# Patient Record
Sex: Female | Born: 1975 | Race: Black or African American | Hispanic: No | Marital: Single | Smoking: Current every day smoker
Health system: Southern US, Community
[De-identification: ages and names within clinical notes are randomized; demographics above are authoritative.]

## PROBLEM LIST (undated history)

## (undated) ENCOUNTER — Inpatient Hospital Stay (HOSPITAL_COMMUNITY): Payer: Self-pay

## (undated) ENCOUNTER — Ambulatory Visit: Admission: EM | Payer: Medicaid Other

## (undated) DIAGNOSIS — D649 Anemia, unspecified: Secondary | ICD-10-CM

## (undated) DIAGNOSIS — Z5189 Encounter for other specified aftercare: Secondary | ICD-10-CM

## (undated) DIAGNOSIS — R87629 Unspecified abnormal cytological findings in specimens from vagina: Secondary | ICD-10-CM

## (undated) DIAGNOSIS — R768 Other specified abnormal immunological findings in serum: Secondary | ICD-10-CM

## (undated) HISTORY — PX: FRACTURE SURGERY: SHX138

## (undated) HISTORY — PX: OTHER SURGICAL HISTORY: SHX169

---
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
## 1999-03-26 ENCOUNTER — Emergency Department (HOSPITAL_COMMUNITY): Admission: EM | Admit: 1999-03-26 | Discharge: 1999-03-26 | Payer: Self-pay | Admitting: Emergency Medicine

## 1999-05-07 ENCOUNTER — Other Ambulatory Visit: Admission: RE | Admit: 1999-05-07 | Discharge: 1999-05-07 | Payer: Self-pay | Admitting: Gynecology

## 2000-04-09 ENCOUNTER — Emergency Department (HOSPITAL_COMMUNITY): Admission: EM | Admit: 2000-04-09 | Discharge: 2000-04-09 | Payer: Self-pay | Admitting: Emergency Medicine

## 2000-09-26 ENCOUNTER — Inpatient Hospital Stay (HOSPITAL_COMMUNITY): Admission: AD | Admit: 2000-09-26 | Discharge: 2000-09-26 | Payer: Self-pay | Admitting: Obstetrics and Gynecology

## 2000-09-26 ENCOUNTER — Encounter: Payer: Self-pay | Admitting: Obstetrics and Gynecology

## 2000-12-17 ENCOUNTER — Ambulatory Visit (HOSPITAL_COMMUNITY): Admission: RE | Admit: 2000-12-17 | Discharge: 2000-12-17 | Payer: Self-pay | Admitting: *Deleted

## 2001-02-21 ENCOUNTER — Inpatient Hospital Stay (HOSPITAL_COMMUNITY): Admission: AD | Admit: 2001-02-21 | Discharge: 2001-02-21 | Payer: Self-pay | Admitting: Obstetrics & Gynecology

## 2001-05-19 ENCOUNTER — Encounter (INDEPENDENT_AMBULATORY_CARE_PROVIDER_SITE_OTHER): Payer: Self-pay | Admitting: *Deleted

## 2001-05-19 ENCOUNTER — Inpatient Hospital Stay (HOSPITAL_COMMUNITY): Admission: AD | Admit: 2001-05-19 | Discharge: 2001-05-23 | Payer: Self-pay | Admitting: *Deleted

## 2001-05-24 ENCOUNTER — Encounter: Admission: RE | Admit: 2001-05-24 | Discharge: 2001-06-23 | Payer: Self-pay | Admitting: Obstetrics

## 2002-03-17 ENCOUNTER — Other Ambulatory Visit: Admission: RE | Admit: 2002-03-17 | Discharge: 2002-03-17 | Payer: Self-pay | Admitting: Family Medicine

## 2003-03-17 ENCOUNTER — Emergency Department (HOSPITAL_COMMUNITY): Admission: EM | Admit: 2003-03-17 | Discharge: 2003-03-17 | Payer: Self-pay | Admitting: Emergency Medicine

## 2003-10-15 ENCOUNTER — Emergency Department (HOSPITAL_COMMUNITY): Admission: EM | Admit: 2003-10-15 | Discharge: 2003-10-15 | Payer: Self-pay | Admitting: Emergency Medicine

## 2004-11-27 ENCOUNTER — Ambulatory Visit (HOSPITAL_COMMUNITY): Admission: RE | Admit: 2004-11-27 | Discharge: 2004-11-27 | Payer: Self-pay | Admitting: Family Medicine

## 2006-04-25 ENCOUNTER — Emergency Department (HOSPITAL_COMMUNITY): Admission: EM | Admit: 2006-04-25 | Discharge: 2006-04-25 | Payer: Self-pay | Admitting: Family Medicine

## 2006-05-14 ENCOUNTER — Emergency Department (HOSPITAL_COMMUNITY): Admission: EM | Admit: 2006-05-14 | Discharge: 2006-05-14 | Payer: Self-pay | Admitting: Emergency Medicine

## 2007-05-02 ENCOUNTER — Emergency Department (HOSPITAL_COMMUNITY): Admission: EM | Admit: 2007-05-02 | Discharge: 2007-05-03 | Payer: Self-pay | Admitting: *Deleted

## 2007-05-10 ENCOUNTER — Ambulatory Visit (HOSPITAL_COMMUNITY): Admission: RE | Admit: 2007-05-10 | Discharge: 2007-05-10 | Payer: Self-pay | Admitting: Orthopedic Surgery

## 2007-07-17 ENCOUNTER — Emergency Department (HOSPITAL_COMMUNITY): Admission: EM | Admit: 2007-07-17 | Discharge: 2007-07-17 | Payer: Self-pay | Admitting: Family Medicine

## 2007-09-07 ENCOUNTER — Encounter: Admission: RE | Admit: 2007-09-07 | Discharge: 2007-09-07 | Payer: Self-pay | Admitting: Family Medicine

## 2007-10-14 ENCOUNTER — Other Ambulatory Visit: Admission: RE | Admit: 2007-10-14 | Discharge: 2007-10-14 | Payer: Self-pay | Admitting: Obstetrics and Gynecology

## 2009-01-22 IMAGING — CR DG ANKLE COMPLETE 3+V*R*
3 series · 3 of 3 positions shown · non-contrast
Comparison: none

CLINICAL DATA: Fall.  Ankle fracture and dislocation.
RIGHT ANKLE ? 3 VIEW:

[view not recorded (1 of 3)]
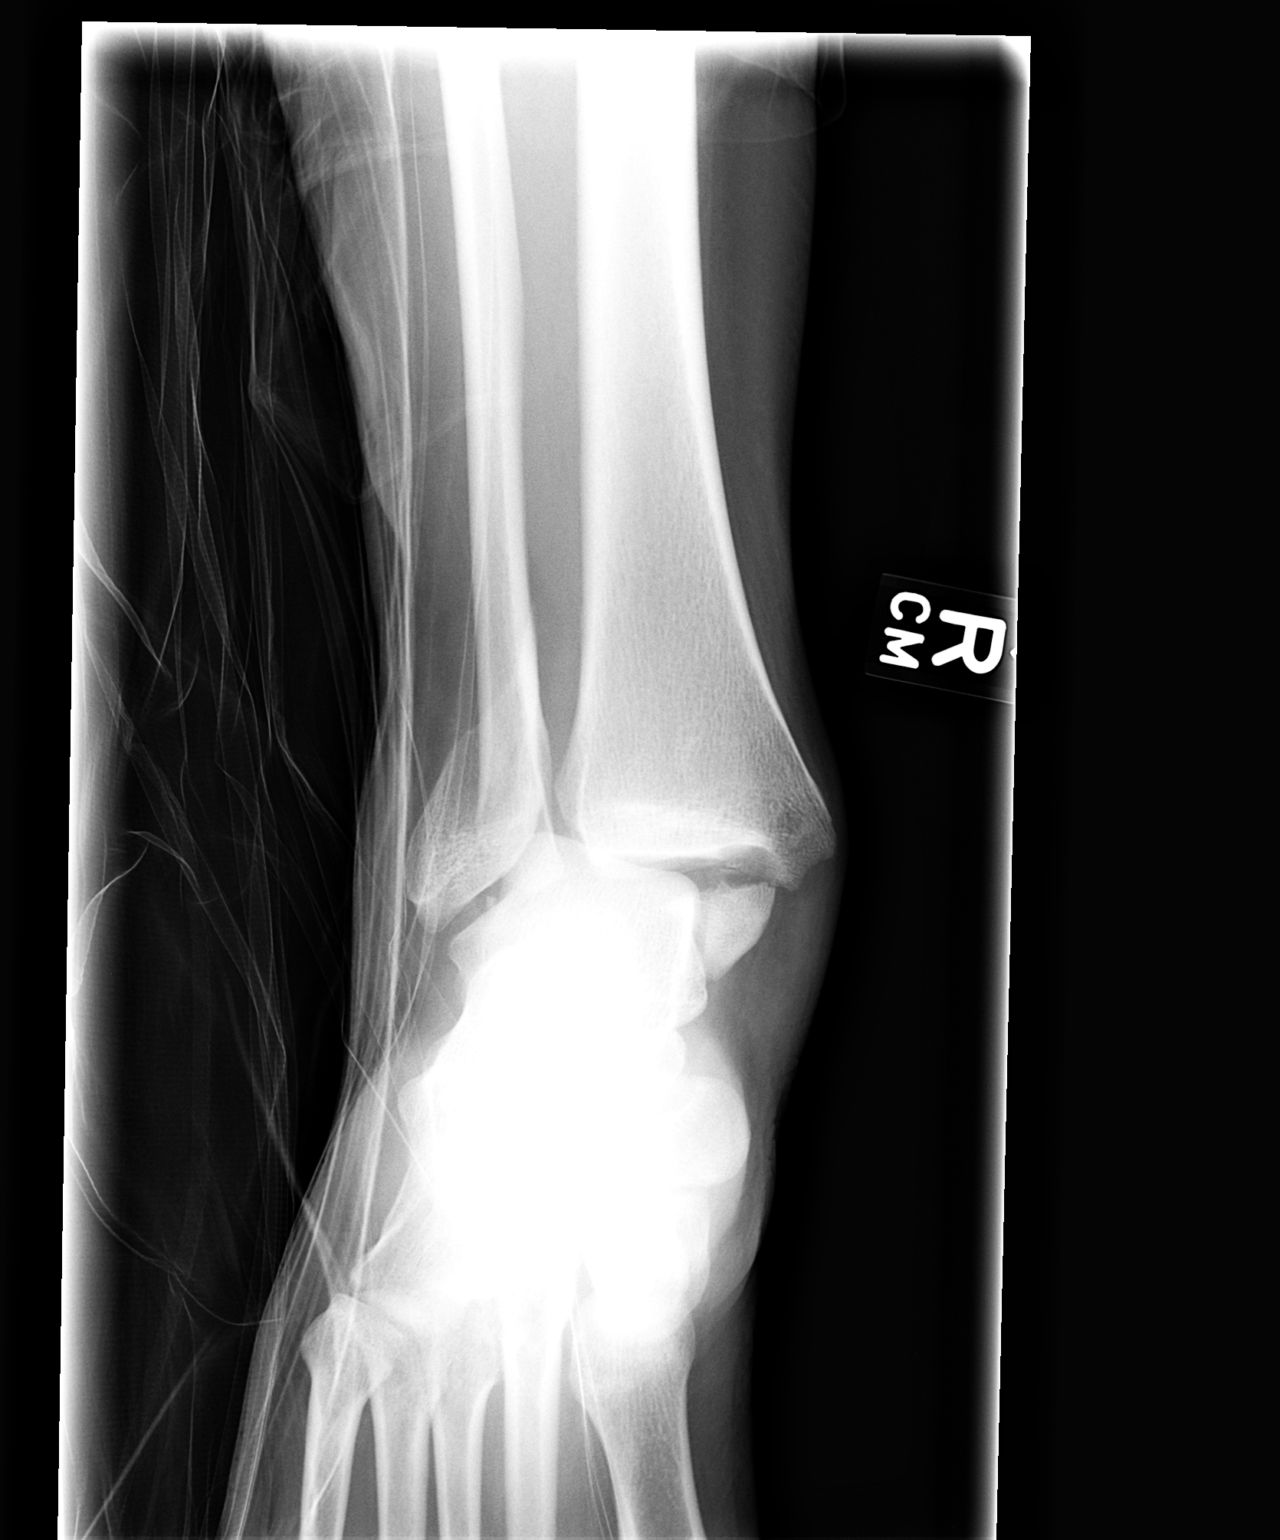

[view not recorded (2 of 3)]
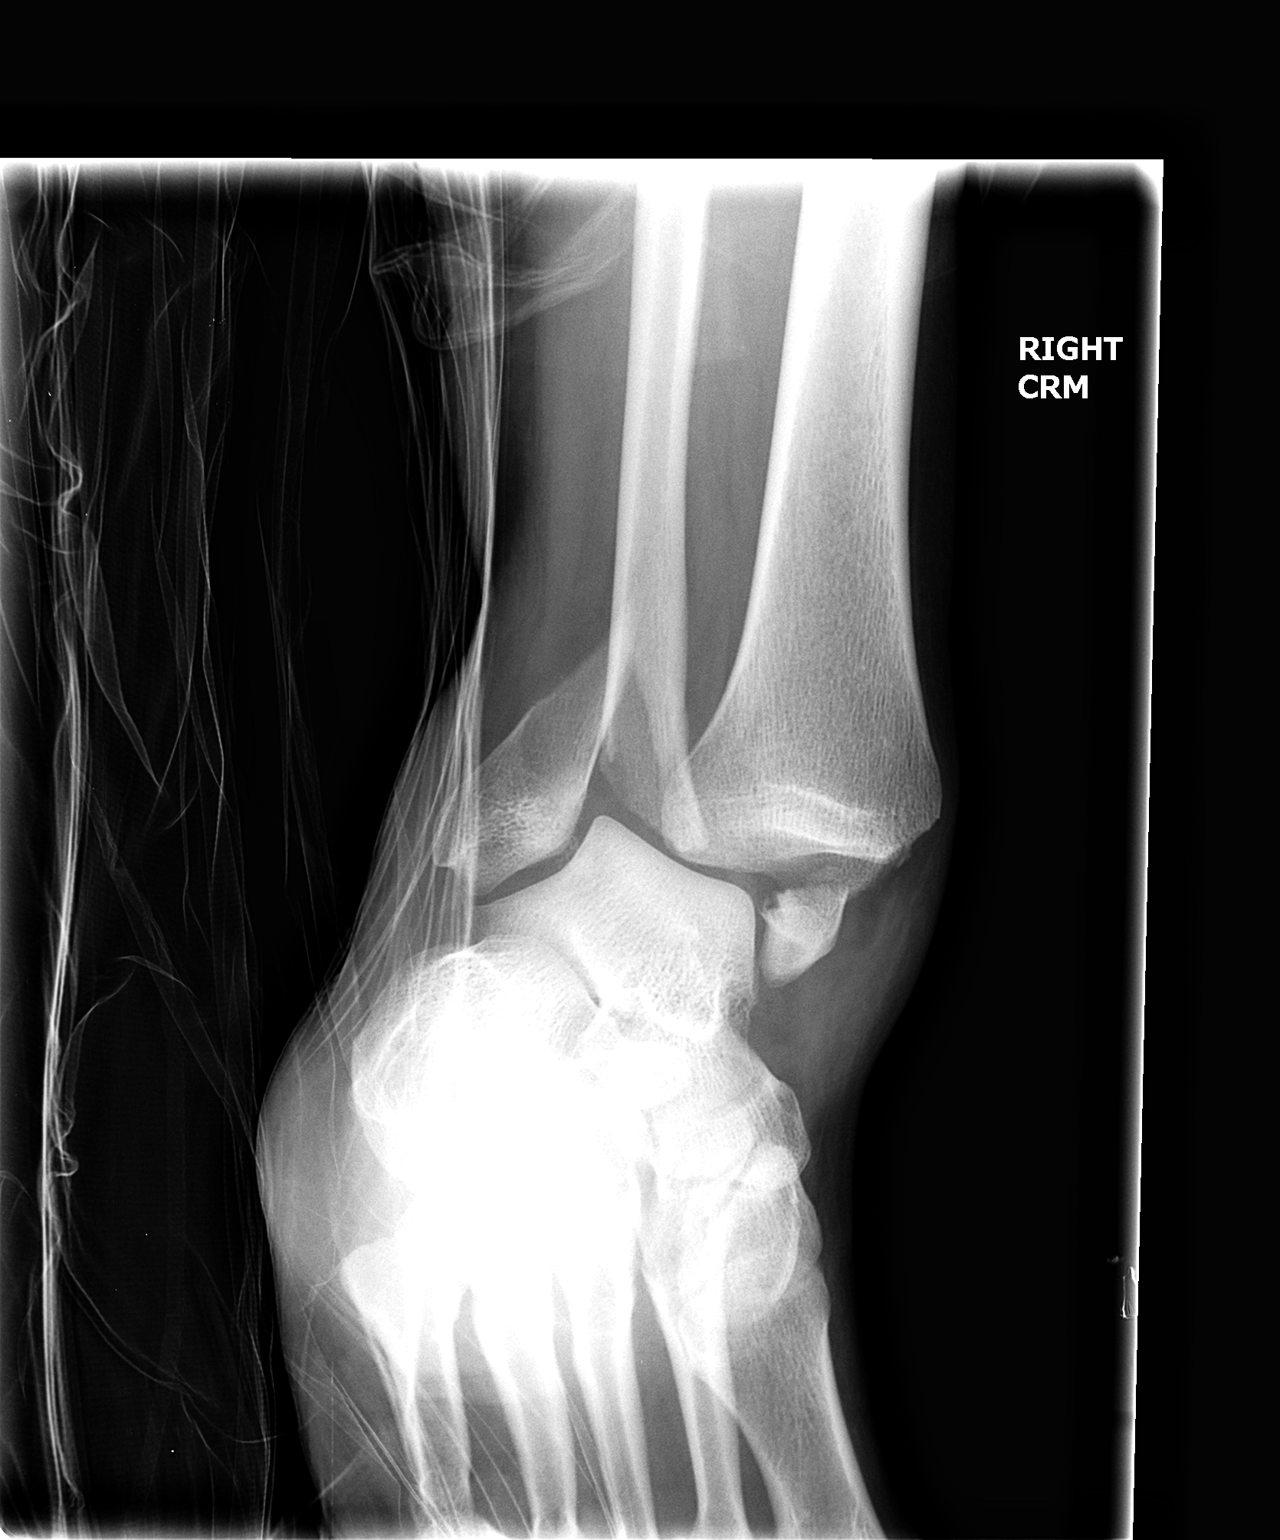

[view not recorded (3 of 3)]
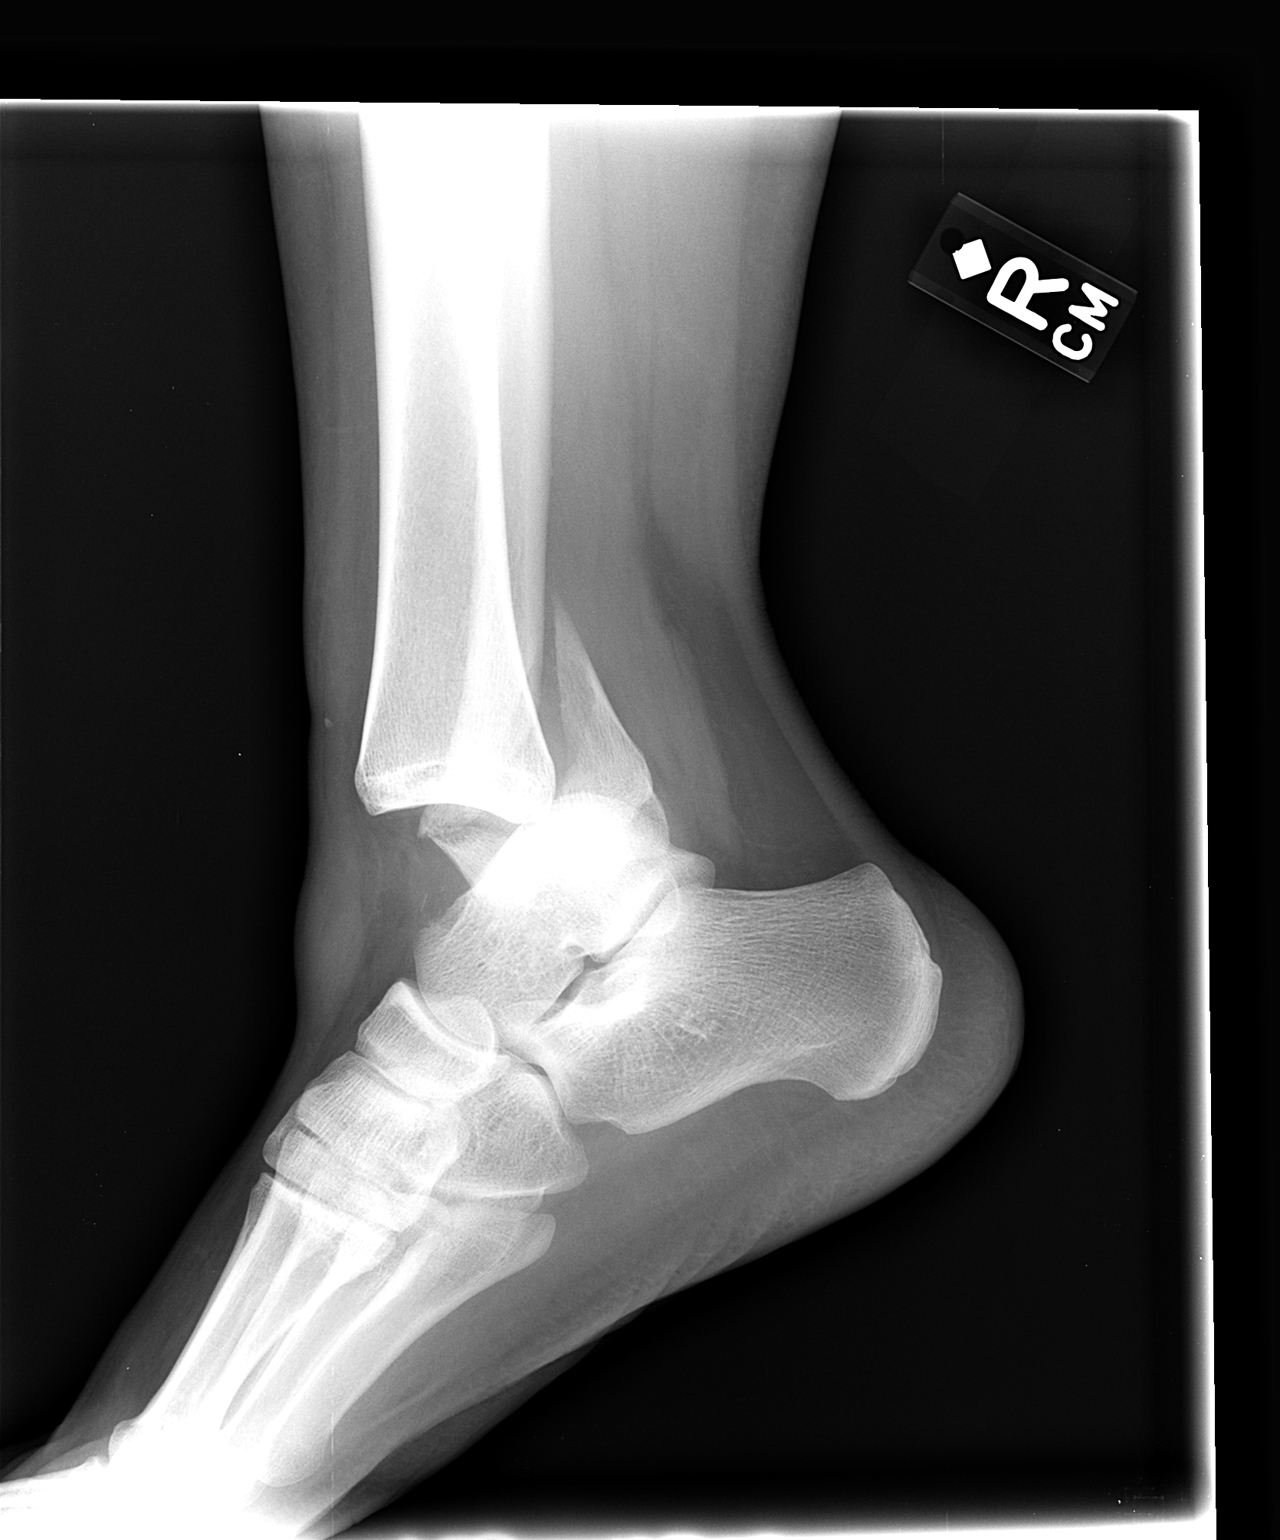

[3 of 3 positions shown; findings below may reference images not displayed]

FINDINGS: Bimalleolar ankle fracture is seen with posterior and lateral dislocation of the talus.
IMPRESSION: Bimalleolar ankle fracture, with talus dislocated laterally and posteriorly.

## 2009-01-22 IMAGING — CR DG HAND COMPLETE 3+V*L*
3 series · 3 of 3 positions shown · non-contrast
Comparison: none

CLINICAL DATA: Left hand injury.
LEFT HAND ? 3 VIEW:

[x hand pa left *]
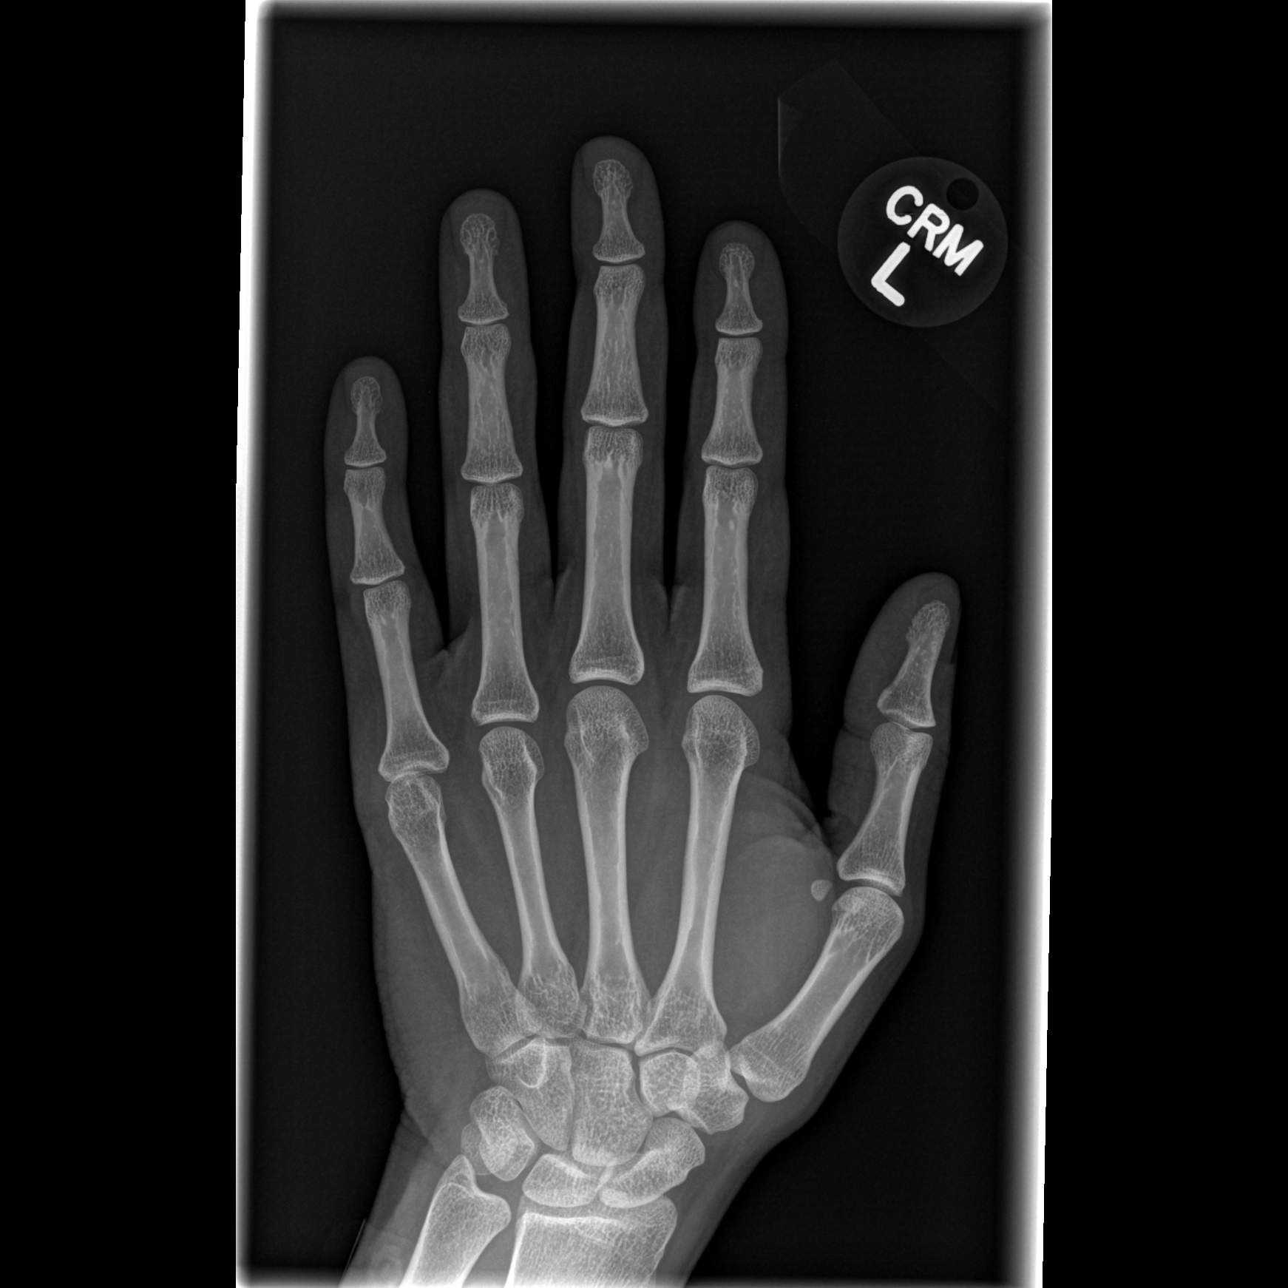

[x hand oblique left *]
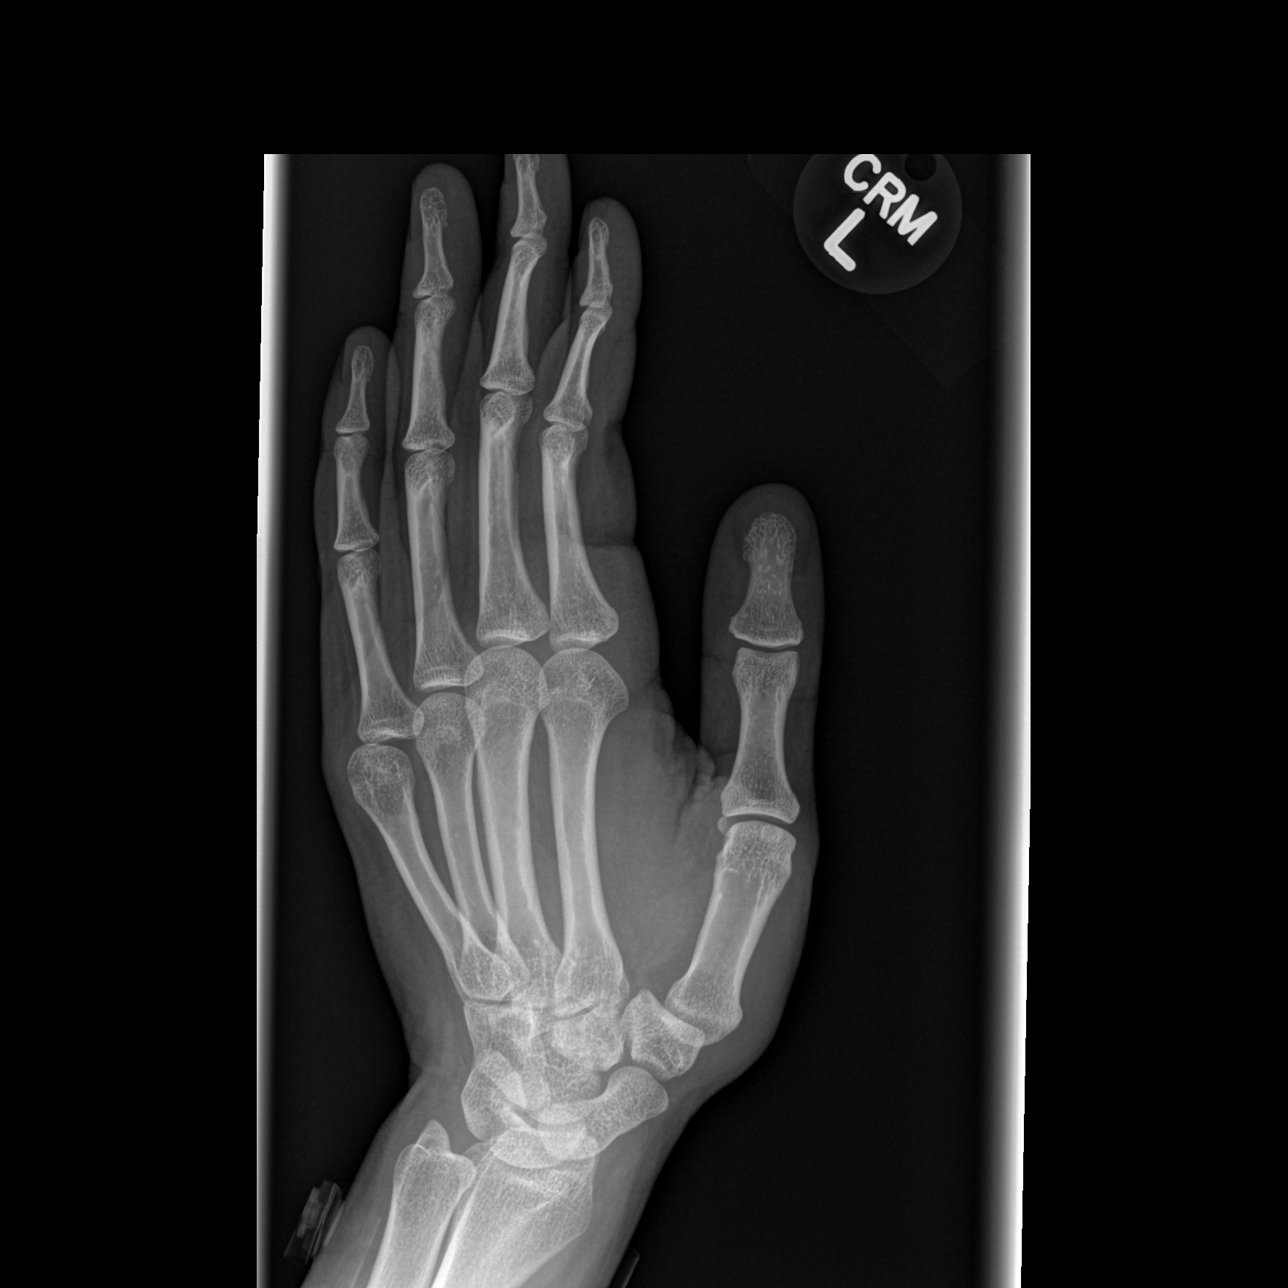

[x hand lat left *]
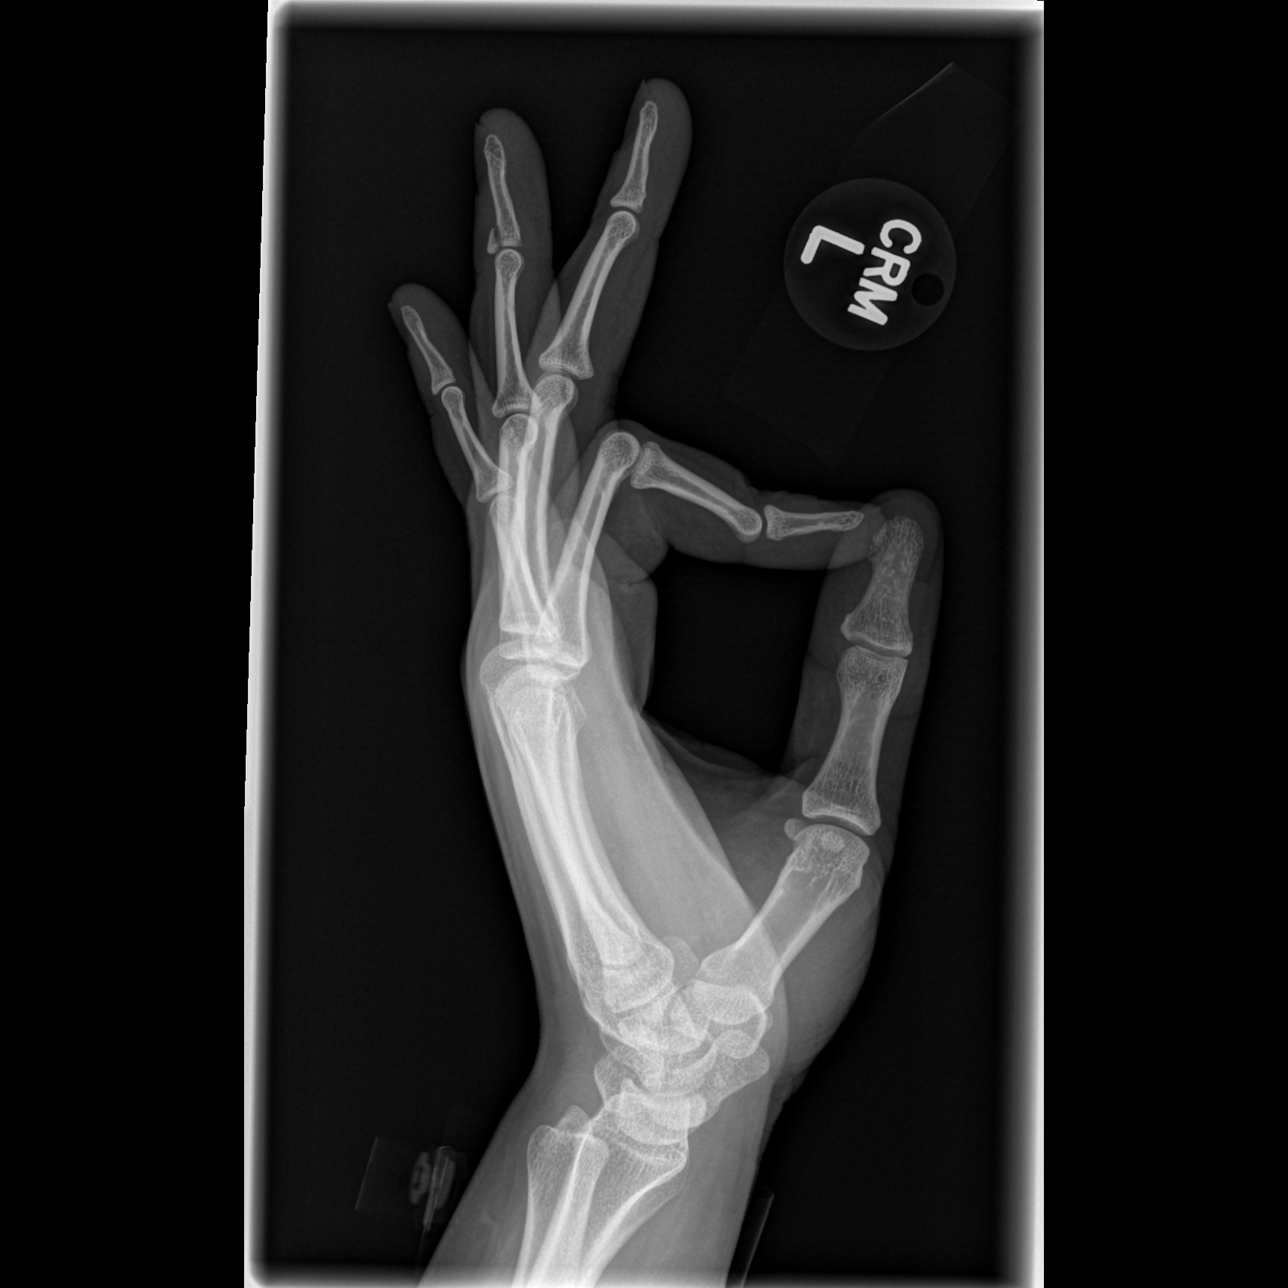

[3 of 3 positions shown; findings below may reference images not displayed]

FINDINGS: There is a fracture along the dorsal base of the distal phalanx of the left ring finger extending into the DIP joint.  There is no evidence of subluxation or dislocation.  The remainder of the left hand is unremarkable.
IMPRESSION: Fracture of the base of the distal phalanx of the ring finger at the DIP joint.

## 2009-03-23 ENCOUNTER — Encounter: Admission: RE | Admit: 2009-03-23 | Discharge: 2009-03-23 | Payer: Self-pay | Admitting: Family Medicine

## 2010-03-26 ENCOUNTER — Other Ambulatory Visit: Admission: RE | Admit: 2010-03-26 | Discharge: 2010-03-26 | Payer: Self-pay | Admitting: Obstetrics and Gynecology

## 2010-10-08 NOTE — Op Note (Signed)
NAME:  Jill Mcdaniel, Jill Mcdaniel              ACCOUNT NO.:  [id number]   MEDICAL RECORD NO.:  [id number]          PATIENT TYPE:  AMB   LOCATION:  DAY                          FACILITY:  [facility]   PHYSICIAN:  Madlyn Frankel. Charlann Boxer, M.D.  DATE OF BIRTH:  1976/03/24   DATE OF PROCEDURE:  05/10/2007  DATE OF DISCHARGE:                               OPERATIVE REPORT   PREOPERATIVE DIAGNOSIS:  Unstable right bimalleolar ankle fracture.   POSTOPERATIVE DIAGNOSIS:  Unstable right bimalleolar ankle fracture.   PROCEDURE:  Open reduction internal fixation right ankle fracture using  Synthes small frag set.   SURGEON:  Dr. Charlann Boxer.   ASSISTANT:  Dwyane Luo.   ANESTHESIA:  General.   BLOOD LOSS:  None.   TOURNIQUET TIME:  88 minutes at 250 mmHg.   COMPLICATIONS:  None.   INDICATIONS FOR PROCEDURE:  Jill Mcdaniel is a 35 year old female seen in the  office after an injury where she tripped on cord rolling her foot. She  had been seen initially in the emergency room where the fracture was  reduced and placed in a splint. She was seen in the office. The risks  and benefits of operative treatment were discussed as nonoperative  intervention would potentially lead to risk of nonunion, malunion, and  unstable ankle. She accepted the risks for benefit of better aligned  ankle. The postoperative course was also discussed, consent obtained.   PROCEDURE IN DETAIL:  The patient was brought to the operative theater.  Once adequate anesthesia, preoperative antibiotics, Ancef, were  administered, the patient was positioned supine. A proximal thigh  tourniquet was placed. I then prescrubbed the right and then prepped and  draped it in a sterile fashion. Landmarks were identified and attention  was first directed laterally. A lateral incision was made from the skin  directly down to bone and then elevated the soft tissues off of the  fracture site. We cleaned of the fracture site from any residual  periosteum.   TECHNIQUE:  I  utilized the lag screw technique with a neutralization  plate. Initially I tried it with a 3.5 screw but the purchase in the  posterior distal portion of the fibula was fairly weak. After I had  initially tried it once, I did try a posterior and a glide technique.  However, the patient's fracture pattern had a spiral portion off the  medial side of the distal fibula making it difficult to maintain  alignment this was. I went back to the lag screw technique.  At this  time, I utilized a  4-0 cancellous screw getting a better purchase. With this anatomically  reduced, was able to get a five-hole neutralization plate, one-third  tubular plate situated. Screws were placed to stabilize the plate in  position.  Radiographs indicate an anatomically reduced distal fibula.  Following this, attention was directed to the medial aspect of the  tibia. A 4-cm incision was made over the fracture site. The fracture was  debrided of periosteum that was within the fracture site.  Once it was  free, I was able to anatomically reduce it, hold it in position  with a  bone tenaculum. It was a relatively small fragment in this Weber B  pattern, so I chose to use a single screw for stabilization. A 2.7 drill  bit was passed and checked under fluoroscopy and then a single 45-mm  partially threaded cancellous screw was passed with a good purchase. The  fracture remained reduced. At this point, the wound was irrigated with  normal saline solution. The medial wound was closed in layers with 2-0  Vicryl and 3-0 nylon. On the lateral side, I reapproximated the fascial  layer first and then the subcu layer and then 3-0 nylon on the skin. The  wounds were cleaned, dried and dressed sterilely with Xeroform dressing  sponges and a bulky wrap.  I placed the patient into a bulky wrap at  this point with 3 ABDs.   At this point, the posterior splint L and U were placed and the foot  held in neutral position.  The patient was  then extubated and brought to  the recovery room in stable addition.   She will be discharged today and return to see me in the office in 2  weeks.  Please note that I did inject the patient with local anesthetic  at the time of skin closure.      Madlyn Frankel Charlann Boxer, M.D.  Electronically Signed     MDO/MEDQ  D:  05/10/2007  T:  05/10/2007  Job:  [id number]

## 2010-10-11 NOTE — Discharge Summary (Signed)
[facility]  Patient:    Jill Mcdaniel, Jill Mcdaniel Visit Number: [id number] MRN: [id number]          Service Type: OBS Location: 910A 9111 01 Attending Physician:  Venita Sheffield Dictated by:   Kathreen Cosier, M.D. Admit Date:  05/19/2001 Discharge Date: 05/23/2001                             Discharge Summary  HISTORY OF PRESENT ILLNESS:  The patient is a 35 year old primigravida, estimated date of confinement 12/23 to 12/26.  HOSPITAL COURSE:  She was admitted in labor on 12/25.  At 6:30 a.m. on 12/26, she was 8 cm, very loose cervix, vertex.  ______ was performed, the fluid was clear.  Tracing was reactive.  She was started on Pitocin stimulation, and by 8:15 on 12/26, she was 8 cm, vertex, -3, temperature up to 104, started on ampicillin 2 g IV q.6h.  Subsequently, the fetal heart rate changed to 170 to 180 with late decelerations with each contraction.  These did not resolve with discontinuation of the Pitocin or with O2 or position change.  She underwent a primary low transverse cesarean section because of nonreassuring fetal heart rate tracing, maternal fever.  Had a female with Apgars of 8 and 8, 7 pounds 2 ounces.  The placenta was posterior and sent to pathology.  Postoperative hemoglobin was 10.  She did well.  She was discharged home on the third postoperative day, ambulatory and on a regular diet.  DISCHARGE MEDICATIONS: 1. Tylox one p.o. q.4h. p.r.n. 2. Micronor one p.o. q.d. 3. Ferrous sulfate 325 mg q.d.  DISCHARGE DIAGNOSIS:  Status post primary low transverse cesarean section at term for nonreassuring fetal heart rate tracing. Dictated by:   Kathreen Cosier, M.D. Attending Physician:  Venita Sheffield DD:  05/23/01 TD:  05/23/01 Job: [id number]

## 2010-10-11 NOTE — H&P (Signed)
[facility]  Patient:    Jill Mcdaniel, Jill Mcdaniel Visit Number: [id number] MRN: [id number]          Service Type: OBS Location: 910A 9111 01 Attending Physician:  Venita Sheffield Dictated by:   Kathreen Cosier, M.D. Admit Date:  05/19/2001                           History and Physical  HISTORY OF PRESENT ILLNESS:   The patient is a 35 year old primigravida with an EDC of December 23 to May 20, 2001.  She was admitted on the night of December 25 in labor.  She had a negative GBS.  She was 8 cm with the vertex at -3.  Amniotomy was performed and the fluid was clear.  She was started on Pitocin stimulation.  By 7:30, her temperature was 100.4.  Fetal heart was 160-170+.  She was given ampicillin 2 g IV q.6h. and started on Pitocin.  She began having decreased variability and decelerations with each contraction, which did not resolve with O2 or position change.  It was decided she would undergo C-section because of nonreassuring fetal heart rate tracing.  PHYSICAL EXAMINATION:  GENERAL:                      Well-developed female appearing her stated age.  HEENT:                        Negative.  LUNGS:                        Clear.  HEART:                        Regular rhythm.  No murmurs.  No gallops.  ABDOMEN:                      Term-size uterus.  Estimated fetal weight 7 lb.  PELVIC:                       As described above.  EXTREMITIES:                  Negative. Dictated by:   Kathreen Cosier, M.D. Attending Physician:  Venita Sheffield DD:  05/20/01 TD:  05/20/01 Job: [id number]

## 2010-10-11 NOTE — Op Note (Signed)
[facility]  Patient:    Jill Mcdaniel, Jill Mcdaniel Visit Number: [id number] MRN: [id number]          Service Type: OBS Location: 910A 9111 01 Attending Physician:  Venita Sheffield Dictated by:   Kathreen Cosier, M.D. Proc. Date: 05/20/01 Admit Date:  05/19/2001                             Operative Report  PREOPERATIVE DIAGNOSIS:       Nonreassuring fetal heart rate tracing, decelerations.  POSTOPERATIVE DIAGNOSIS:      Nonreassuring fetal heart rate tracing, decelerations.  OPERATION:  SURGEON:                      Kathreen Cosier, M.D.  ANESTHESIA:                   Epidural.  DESCRIPTION OF PROCEDURE:     The patient was placed on the operating table in the supine position.  After epidural dosed, abdomen prepped and draped. Bladder emptied by the Foley catheter.  A transverse suprapubic incision was made, carried down to the rectus fascia.  Fascia incised the length of the incision.  Rectus muscles retracted laterally and incised longitudinally. Transverse incision made in the visceroperitoneum above the bladder.  Bladder mobilized inferiorly.  A transverse lower uterine incision made. The patient delivered from the LOA position of a female, Apgars 8 and 8.  Team was in attendance and _____ .  The baby weighed 7 pounds 2 ounces.  The placenta was posterior, removed manually.  Uterine cavity cleaned and dried.  Uterine incision closed in one layer with continuous suture of #1 chromic.  Hemostasis was satisfactory.  Bladder flap reattached with 2-0 chromic.  Uterus well contracted.  Tubes and ovaries were normal.  Abdomen closed in layers. Peritoneum with continuous sutures of 0 chromic, fascia with continuous sutures of 0 Dexon and the skin closed with subcuticular stitch of 3-0 Monocryl. Dictated by:   Kathreen Cosier, M.D. Attending Physician:  Venita Sheffield DD:  05/20/01 TD:  05/20/01 Job:  [id number]

## 2011-01-09 ENCOUNTER — Other Ambulatory Visit: Payer: Self-pay | Admitting: Family Medicine

## 2011-01-09 ENCOUNTER — Other Ambulatory Visit (HOSPITAL_COMMUNITY)
Admission: RE | Admit: 2011-01-09 | Discharge: 2011-01-09 | Disposition: A | Discharge status: Home / Self Care | Payer: 59 | Source: Ambulatory Visit | Attending: Family Medicine | Admitting: Family Medicine

## 2011-01-09 DIAGNOSIS — R87610 Atypical squamous cells of undetermined significance on cytologic smear of cervix (ASC-US): Secondary | ICD-10-CM | POA: Insufficient documentation

## 2011-02-28 LAB — URINALYSIS, ROUTINE W REFLEX MICROSCOPIC
Bilirubin Urine: NEGATIVE
Glucose, UA: NEGATIVE
Ketones, ur: NEGATIVE
Nitrite: NEGATIVE
Protein, ur: 100 — AB
Specific Gravity, Urine: 1.037 — ABNORMAL HIGH
Urobilinogen, UA: 0.2
pH: 5.5

## 2011-02-28 LAB — APTT: aPTT: 30

## 2011-02-28 LAB — BASIC METABOLIC PANEL
BUN: 9
GFR calc Af Amer: >60
GFR calc non Af Amer: >60
Glucose, Bld: 87
Sodium: 138

## 2011-02-28 LAB — PROTIME-INR
INR: 1
Prothrombin Time: 13.1

## 2011-02-28 LAB — URINE MICROSCOPIC-ADD ON

## 2011-02-28 LAB — CBC
Hemoglobin: 8.6 — ABNORMAL LOW
MCHC: 32.9
Platelets: 329
RBC: 3.43 — ABNORMAL LOW

## 2011-02-28 LAB — DIFFERENTIAL: Neutrophils Relative %: 60

## 2011-03-03 LAB — CBC
HCT: 32 — ABNORMAL LOW
MCHC: 32.1
Platelets: 397
RDW: 16.8 — ABNORMAL HIGH
WBC: 13.4 — ABNORMAL HIGH

## 2011-03-03 LAB — BASIC METABOLIC PANEL
BUN: 14
Chloride: 102
GFR calc Af Amer: >60
Glucose, Bld: 92
Potassium: 3.2 — ABNORMAL LOW
Sodium: 133 — ABNORMAL LOW

## 2011-03-03 LAB — DIFFERENTIAL
Basophils Relative: 1
Lymphocytes Relative: 13
Lymphs Abs: 1.8
Monocytes Absolute: 0.9
Neutro Abs: 10.7 — ABNORMAL HIGH
Neutrophils Relative %: 80 — ABNORMAL HIGH

## 2011-08-17 ENCOUNTER — Encounter (HOSPITAL_COMMUNITY): Payer: Self-pay | Admitting: *Deleted

## 2011-08-17 ENCOUNTER — Emergency Department (HOSPITAL_COMMUNITY)
Admission: EM | Admit: 2011-08-17 | Discharge: 2011-08-17 | Disposition: A | Discharge status: Home / Self Care | Payer: Self-pay | Attending: Emergency Medicine | Admitting: Emergency Medicine

## 2011-08-17 DIAGNOSIS — L02412 Cutaneous abscess of left axilla: Secondary | ICD-10-CM

## 2011-08-17 DIAGNOSIS — F17200 Nicotine dependence, unspecified, uncomplicated: Secondary | ICD-10-CM | POA: Insufficient documentation

## 2011-08-17 DIAGNOSIS — IMO0002 Reserved for concepts with insufficient information to code with codable children: Secondary | ICD-10-CM | POA: Insufficient documentation

## 2011-08-17 MED ORDER — LIDOCAINE-EPINEPHRINE 2 %-1:100000 IJ SOLN
20.0000 mL | Freq: Once | INTRAMUSCULAR | Status: AC
  Administered 2011-08-17: 20 mL

## 2011-08-17 NOTE — ED Provider Notes (Signed)
History     CSN: [id number]  Arrival date & time 08/17/11  1222   First MD Initiated Contact with Patient 08/17/11 1324      Chief Complaint  Patient presents with  . Recurrent Skin Infections    "Boil" left arm pit    (Consider location/radiation/quality/duration/timing/severity/associated sxs/prior treatment) HPI History from patient. 36 year old female with history of abscesses in the past presents with an abscess to her left axilla. She states that this came up approximately 3-4 days ago and has increased in size since then. She has attempted warm compresses numerous times without relief. Has been taking ibuprofen for pain which has been minimally helpful. Pain is worse with adducting her arm.Has not noticed any drainage from the area. Has never had to have incision and drainage in the past. Denies fever, chills, nausea, vomiting, or other systemic complaints. Does not have a history of diabetes.   History reviewed. No pertinent past medical history.  Past Surgical History  Procedure Date  . Fracture surgery   . Abortions     History reviewed. No pertinent family history.  History  Substance Use Topics  . Smoking status: Current Everyday Smoker -- 1.0 packs/day    Types: Cigarettes  . Smokeless tobacco: Never Used  . Alcohol Use: Yes     socially    OB History    Grav Para Term Preterm Abortions TAB SAB Ect Mult Living                  Review of Systems  Constitutional: Negative for fever, chills, activity change and appetite change.  Gastrointestinal: Negative for nausea and vomiting.  Musculoskeletal: Negative for myalgias and arthralgias.  Skin: Positive for color change and wound.    Allergies  Review of patient's allergies indicates no known allergies.  Home Medications   Current Outpatient Rx  Name Route Sig Dispense Refill  . IBUPROFEN 200 MG PO TABS Oral Take 400 mg by mouth every 6 (six) hours as needed. pain      BP 147/91  Pulse 85   Temp(Src) 97.6 F (36.4 C) (Axillary)  Resp 18  Wt 185 lb (83.915 kg)  SpO2 100%  LMP 08/17/2011  Physical Exam  Nursing note and vitals reviewed. Constitutional: She appears well-developed and well-nourished. No distress.  HENT:  Head: Normocephalic and atraumatic.  Cardiovascular: Normal rate.   Pulmonary/Chest: Effort normal.  Musculoskeletal: Normal range of motion.  Neurological: She is alert.  Skin: Skin is warm and dry. She is not diaphoretic.       Large, approximately 4-5 cm x 1.5 cm area of induration consistent with abscess to the left axilla. There is no overlying cellulitis. Area is tender to palpation. No drainage noted at present.  Psychiatric: She has a normal mood and affect.    ED Course  Procedures (including critical care time)  INCISION AND DRAINAGE Performed by: Grant Fontana Consent: Verbal consent obtained. Risks and benefits: risks, benefits and alternatives were discussed Type: abscess  Body area: L axilla  Anesthesia: local infiltration  Local anesthetic: lidocaine 2% with epinephrine  Anesthetic total: 2.5 ml  Complexity: complex Blunt dissection to break up loculations  Drainage: purulent  Drainage amount: copious  Packing material: 1/4 in iodoform gauze  Patient tolerance: Patient tolerated the procedure well with no immediate complications.     Labs Reviewed - No data to display No results found.   1. Abscess of left axilla       MDM  Abscess was incised  and drained, which patient tolerated well. It was packed with iodoform gauze. She was instructed to return to the ED or to urgent care in 2 days for a wound recheck and packing removal. Instructed to use ibuprofen as needed for pain. Return precautions discussed.        Grant Fontana, [place] 08/17/11 1537

## 2011-08-17 NOTE — Discharge Instructions (Signed)
Your abscess has been drained. Please keep the area clean and dry and covered with a bandage. You may shower as normal as long as you do not scrubbed vigorously at the area. We will need to followup in 2 days for a wound recheck. This may be done here or at urgent care. If you run a high fever, noticed increased swelling or redness around the area, or have any other worrisome symptoms, please return to the ER.  Abscess Care After An abscess (also called a boil or furuncle) is an infected area that contains a collection of pus. Signs and symptoms of an abscess include pain, tenderness, redness, or hardness, or you may feel a moveable soft area under your skin. An abscess can occur anywhere in the body. The infection may spread to surrounding tissues causing cellulitis. A cut (incision) by the surgeon was made over your abscess and the pus was drained out. Gauze may have been packed into the space to provide a drain that will allow the cavity to heal from the inside outwards. The boil may be painful for 5 to 7 days. Most people with a boil do not have high fevers. Your abscess, if seen early, may not have localized, and may not have been lanced. If not, another appointment may be required for this if it does not get better on its own or with medications. HOME CARE INSTRUCTIONS   Only take over-the-counter or prescription medicines for pain, discomfort, or fever as directed by your caregiver.   When you bathe, soak and then remove gauze or iodoform packs at least daily or as directed by your caregiver. You may then wash the wound gently with mild soapy water. Repack with gauze or do as your caregiver directs.  SEEK IMMEDIATE MEDICAL CARE IF:   You develop increased pain, swelling, redness, drainage, or bleeding in the wound site.   You develop signs of generalized infection including muscle aches, chills, fever, or a general ill feeling.   An oral temperature above 102 F (38.9 C) develops, not  controlled by medication.  See your caregiver for a recheck if you develop any of the symptoms described above. If medications (antibiotics) were prescribed, take them as directed. Document Released: 11/28/2004 Document Revised: 05/01/2011 Document Reviewed: 07/26/2007 [employer] Patient Information 2012 [place], [place].

## 2011-08-17 NOTE — ED Notes (Signed)
Pt from home with reports of "a boil" in left arm pit that was first noticed 3-4 days ago, has tried warm compresses and has been taking Ibuprofen for pain with no improvement. Pt denies oozing or drainage from area.

## 2011-08-17 NOTE — ED Provider Notes (Signed)
Medical screening examination/treatment/procedure(s) were performed by non-physician practitioner and as supervising physician I was immediately available for consultation/collaboration.   Carleene Cooper III, MD 08/17/11 [phone]

## 2011-08-17 NOTE — ED Notes (Signed)
PA at the bedside.

## 2011-08-17 NOTE — ED Notes (Signed)
Pt c/o nodule in left armpit, stated it started 4 days ago and has gotten progressively larger. Tender to touch. Approx 1 /2 inch long

## 2011-08-20 ENCOUNTER — Emergency Department (HOSPITAL_COMMUNITY)
Admission: EM | Admit: 2011-08-20 | Discharge: 2011-08-20 | Disposition: A | Discharge status: Home / Self Care | Payer: Self-pay | Attending: Emergency Medicine | Admitting: Emergency Medicine

## 2011-08-20 ENCOUNTER — Encounter (HOSPITAL_COMMUNITY): Payer: Self-pay | Admitting: Emergency Medicine

## 2011-08-20 DIAGNOSIS — Z4801 Encounter for change or removal of surgical wound dressing: Secondary | ICD-10-CM | POA: Insufficient documentation

## 2011-08-20 DIAGNOSIS — F17200 Nicotine dependence, unspecified, uncomplicated: Secondary | ICD-10-CM | POA: Insufficient documentation

## 2011-08-20 DIAGNOSIS — Z5189 Encounter for other specified aftercare: Secondary | ICD-10-CM

## 2011-08-20 NOTE — ED Provider Notes (Signed)
Medical screening examination/treatment/procedure(s) were performed by non-physician practitioner and as supervising physician I was immediately available for consultation/collaboration.  Aiana Nordquist, MD 08/20/11 1549 

## 2011-08-20 NOTE — ED Provider Notes (Signed)
History     CSN: [id number]  Arrival date & time 08/20/11  1130   First MD Initiated Contact with Patient 08/20/11 1153      Chief Complaint  Patient presents with  . Wound Check    (Consider location/radiation/quality/duration/timing/severity/associated sxs/prior treatment) HPI Comments: Patient presents to ED for removal of packing for abscess under left axilla. Patient had I&D done in ED 3 days ago.  Pt states that since then, the wound continues to drain purulent material, the pain is greatly improved, denies fevers.    Patient is a 36 y.o. female presenting with wound check. The history is provided by the patient and medical records.  Wound Check     History reviewed. No pertinent past medical history.  Past Surgical History  Procedure Date  . Fracture surgery   . Abortions     No family history on file.  History  Substance Use Topics  . Smoking status: Current Everyday Smoker -- 1.0 packs/day    Types: Cigarettes  . Smokeless tobacco: Never Used  . Alcohol Use: Yes     socially    OB History    Grav Para Term Preterm Abortions TAB SAB Ect Mult Living                  Review of Systems  Constitutional: Negative for fever.  All other systems reviewed and are negative.    Allergies  Review of patient's allergies indicates no known allergies.  Home Medications   Current Outpatient Rx  Name Route Sig Dispense Refill  . IBUPROFEN 200 MG PO TABS Oral Take 400 mg by mouth every 6 (six) hours as needed. pain      BP 137/89  Pulse 91  Temp(Src) 98.8 F (37.1 C) (Oral)  Resp 18  SpO2 100%  LMP 08/17/2011  Physical Exam  Nursing note and vitals reviewed. Constitutional: She is oriented to person, place, and time. She appears well-developed and well-nourished.  HENT:  Head: Normocephalic and atraumatic.  Neck: Neck supple.  Pulmonary/Chest: Effort normal.  Neurological: She is alert and oriented to person, place, and time.  Skin:       Left  axilla with packing in place.  Purulent drainage on packing.  Pink granulation tissue at base.      ED Course  Procedures (including critical care time)  Labs Reviewed - No data to display No results found.   Abscess recheck:   Left axilla with packing material in place.  I removed the packing material, which was coated in yellow purulent material.  Two incisions in abscess patent, base of wound is pink granulation tissue.     1. Abscess re-check       MDM  Patient here for recheck of abscess 3 days after I&D.  Patient's pain is much improved, abscess if healing well, no cellulitis.  She has not had a fever.  Discussed wound care with the patient, precautions for immediate return.  Patient verbalizes understanding and agrees with plan.          Rise Patience, [place] 08/20/11 1529

## 2011-08-20 NOTE — ED Notes (Signed)
Pt presenting to ed with c/o packing removal under left axilla. Pt denies pain and fever at this time.

## 2011-08-20 NOTE — Discharge Instructions (Signed)
Please continue to keep the area clean and covered with guaze.  Change the gauze twice daily.  You may also continue warm moist compresses to encourage drainage.  Return to the ER immediately if you develop redness, swelling, increased pus draining from the wound, or fevers greater than 100.4.  You may return to the ER at any time for worsening condition or any new symptoms that concern you.  Wound Check Your wound appears healthy today. Your wound will heal gradually over time. Eventually a scar will form that will fade with time. FACTORS THAT AFFECT SCAR FORMATION:  People differ in the severity in which they scar.   Scar severity varies according to location, size, and the traits you inherited from your parents (genetic predisposition).   Irritation to the wound from infection, rubbing, or chemical exposure will increase the amount of scar formation.  HOME CARE INSTRUCTIONS   If you were given a dressing, you should change it at least once a day or as instructed by your caregiver. If the bandage sticks, soak it off with a solution of hydrogen peroxide.   If the bandage becomes wet, dirty, or develops a bad smell, change it as soon as possible.   Look for signs of infection.   Only take over-the-counter or prescription medicines for pain, discomfort, or fever as directed by your caregiver.  SEEK IMMEDIATE MEDICAL CARE IF:   You have redness, swelling, or increasing pain in the wound.   You notice pus coming from the wound.   You have a fever.   You notice a bad smell coming from the wound or dressing.  Document Released: 02/16/2004 Document Revised: 05/01/2011 Document Reviewed: 05/12/2005 [facility] Patient Information 2012 [place], [place].

## 2012-09-09 ENCOUNTER — Other Ambulatory Visit (HOSPITAL_COMMUNITY)
Admission: RE | Admit: 2012-09-09 | Discharge: 2012-09-09 | Disposition: A | Discharge status: Home / Self Care | Payer: 59 | Source: Ambulatory Visit | Attending: Obstetrics and Gynecology | Admitting: Obstetrics and Gynecology

## 2012-09-09 ENCOUNTER — Other Ambulatory Visit: Payer: Self-pay | Admitting: Nurse Practitioner

## 2012-09-09 DIAGNOSIS — Z01419 Encounter for gynecological examination (general) (routine) without abnormal findings: Secondary | ICD-10-CM | POA: Insufficient documentation

## 2012-11-25 ENCOUNTER — Other Ambulatory Visit: Payer: Self-pay | Admitting: Nurse Practitioner

## 2013-06-07 ENCOUNTER — Other Ambulatory Visit: Payer: Self-pay | Admitting: Nurse Practitioner

## 2013-06-07 ENCOUNTER — Other Ambulatory Visit (HOSPITAL_COMMUNITY)
Admission: RE | Admit: 2013-06-07 | Discharge: 2013-06-07 | Disposition: A | Discharge status: Home / Self Care | Payer: Medicaid Other | Source: Ambulatory Visit | Attending: Nurse Practitioner | Admitting: Nurse Practitioner

## 2013-06-07 DIAGNOSIS — R87810 Cervical high risk human papillomavirus (HPV) DNA test positive: Secondary | ICD-10-CM | POA: Insufficient documentation

## 2013-06-07 DIAGNOSIS — Z124 Encounter for screening for malignant neoplasm of cervix: Secondary | ICD-10-CM | POA: Insufficient documentation

## 2013-06-07 DIAGNOSIS — Z1151 Encounter for screening for human papillomavirus (HPV): Secondary | ICD-10-CM | POA: Insufficient documentation

## 2013-08-29 ENCOUNTER — Inpatient Hospital Stay (HOSPITAL_COMMUNITY): Payer: Medicaid Other

## 2013-08-29 ENCOUNTER — Inpatient Hospital Stay (HOSPITAL_COMMUNITY)
Admission: AD | Admit: 2013-08-29 | Discharge: 2013-08-29 | Disposition: A | Discharge status: Home / Self Care | Payer: Medicaid Other | Source: Ambulatory Visit | Attending: Obstetrics and Gynecology | Admitting: Obstetrics and Gynecology

## 2013-08-29 ENCOUNTER — Encounter (HOSPITAL_COMMUNITY): Payer: Self-pay

## 2013-08-29 DIAGNOSIS — O26851 Spotting complicating pregnancy, first trimester: Secondary | ICD-10-CM

## 2013-08-29 DIAGNOSIS — O99330 Smoking (tobacco) complicating pregnancy, unspecified trimester: Secondary | ICD-10-CM | POA: Insufficient documentation

## 2013-08-29 DIAGNOSIS — O26859 Spotting complicating pregnancy, unspecified trimester: Secondary | ICD-10-CM | POA: Insufficient documentation

## 2013-08-29 HISTORY — DX: Other specified abnormal immunological findings in serum: R76.8

## 2013-08-29 HISTORY — DX: Anemia, unspecified: D64.9

## 2013-08-29 HISTORY — DX: Unspecified abnormal cytological findings in specimens from vagina: R87.629

## 2013-08-29 HISTORY — DX: Encounter for other specified aftercare: Z51.89

## 2013-08-29 LAB — CBC
HCT: 40.1 % (ref 36.0–46.0)
Hemoglobin: 13.9 g/dL (ref 12.0–15.0)
MCH: 31.2 pg (ref 26.0–34.0)
MCHC: 34.7 g/dL (ref 30.0–36.0)
MCV: 90.1 fL (ref 78.0–100.0)
PLATELETS: 289 10*3/uL (ref 150–400)
RBC: 4.45 MIL/uL (ref 3.87–5.11)
RDW: 14.2 % (ref 11.5–15.5)
WBC: 13.7 10*3/uL — ABNORMAL HIGH (ref 4.0–10.5)

## 2013-08-29 LAB — URINALYSIS, ROUTINE W REFLEX MICROSCOPIC
BILIRUBIN URINE: NEGATIVE
Glucose, UA: NEGATIVE mg/dL
KETONES UR: 40 mg/dL — AB
Leukocytes, UA: NEGATIVE
Nitrite: NEGATIVE
Protein, ur: NEGATIVE mg/dL
UROBILINOGEN UA: 1 mg/dL (ref 0.0–1.0)
pH: 6 (ref 5.0–8.0)

## 2013-08-29 LAB — URINE MICROSCOPIC-ADD ON

## 2013-08-29 LAB — POCT PREGNANCY, URINE: Preg Test, Ur: POSITIVE — AB

## 2013-08-29 LAB — HCG, QUANTITATIVE, PREGNANCY: hCG, Beta Chain, Quant, S: 33449 m[IU]/mL — ABNORMAL HIGH (ref <5)

## 2013-08-29 NOTE — MAU Note (Signed)
Last seen at [facility] in Nov 2014. LMP in February, 4 positive HPTs since last week. Vaginal bleeding since las Thursday; pink/brown on toilet paper. Denies abdominal pain currently. Pain after intercourse 2 weeks ago. Denies dysuria.

## 2013-08-29 NOTE — MAU Provider Note (Signed)
History     CSN: [id number]  Arrival date and time: 08/29/13 2010   None     Chief Complaint  Patient presents with  . Possible Pregnancy  . Vaginal Bleeding   HPI  Jill Mcdaniel is a 38 y.o. [id number] at 6219w2d who presents today with bleeding. She states that she took 2 HPTs that were positive, and she started to have bleeding on 08/25/13. She states that is was pink or brown, and similar to a light period. She denies any pain at this time. She states she goes to [facility], and was last seen in late December or early January of this year.   Past Medical History  Diagnosis Date  . Seropositive for herpes simplex 2 infection   . Blood transfusion without reported diagnosis 1997    anemia  . Anemia   . Vaginal Pap smear, abnormal     Past Surgical History  Procedure Laterality Date  . Fracture surgery    . Abortions    . Cesarean section      History reviewed. No pertinent family history.  History  Substance Use Topics  . Smoking status: Current Every Day Smoker -- 1.00 packs/day    Types: Cigarettes  . Smokeless tobacco: Never Used  . Alcohol Use: Yes     Comment: socially    Allergies: No Known Allergies  Prescriptions prior to admission  Medication Sig Dispense Refill  . cholecalciferol (VITAMIN D) 1000 UNITS tablet Take 1,000 Units by mouth daily.      Marland Kitchen. ibuprofen (ADVIL,MOTRIN) 200 MG tablet Take 400 mg by mouth every 6 (six) hours as needed. pain      . Iron TABS Take 1 tablet by mouth daily.       . ValACYclovir HCl (VALTREX PO) Take 1 tablet by mouth daily.        ROS Physical Exam   Blood pressure 157/94, pulse 90, temperature 99 F (37.2 C), temperature source Oral, resp. rate 16, height 5' 3.5" (1.613 m), weight 86.002 kg (189 lb 9.6 oz), last menstrual period 07/16/2013, SpO2 100.00%.  Physical Exam  Nursing note and vitals reviewed. Constitutional: She is oriented to person, place, and time. She appears well-developed. No distress.  Cardiovascular:  Normal rate.   Respiratory: Effort normal.  GI: Soft. There is no tenderness. There is no rebound.  Neurological: She is alert and oriented to person, place, and time.  Skin: Skin is warm and dry.  Psychiatric: She has a normal mood and affect.    MAU Course  Procedures  Results for orders placed during the hospital encounter of 08/29/13 (from the past 24 hour(s))  URINALYSIS, ROUTINE W REFLEX MICROSCOPIC     Status: Abnormal   Collection Time    08/29/13  8:21 PM      Result Value Ref Range   Color, Urine YELLOW  YELLOW   APPearance CLEAR  CLEAR   Specific Gravity, Urine >1.030 (*) 1.005 - 1.030   pH 6.0  5.0 - 8.0   Glucose, UA NEGATIVE  NEGATIVE mg/dL   Hgb urine dipstick LARGE (*) NEGATIVE   Bilirubin Urine NEGATIVE  NEGATIVE   Ketones, ur 40 (*) NEGATIVE mg/dL   Protein, ur NEGATIVE  NEGATIVE mg/dL   Urobilinogen, UA 1.0  0.0 - 1.0 mg/dL   Nitrite NEGATIVE  NEGATIVE   Leukocytes, UA NEGATIVE  NEGATIVE  URINE MICROSCOPIC-ADD ON     Status: Abnormal   Collection Time    08/29/13  8:21 PM  Result Value Ref Range   Squamous Epithelial / LPF FEW (*) RARE   WBC, UA 0-2  <3 WBC/hpf   RBC / HPF 3-6  <3 RBC/hpf   Bacteria, UA FEW (*) RARE  POCT PREGNANCY, URINE     Status: Abnormal   Collection Time    08/29/13  8:32 PM      Result Value Ref Range   Preg Test, Ur POSITIVE (*) NEGATIVE  CBC     Status: Abnormal   Collection Time    08/29/13  9:25 PM      Result Value Ref Range   WBC 13.7 (*) 4.0 - 10.5 K/uL   RBC 4.45  3.87 - 5.11 MIL/uL   Hemoglobin 13.9  12.0 - 15.0 g/dL   HCT 78.2  95.6 - 21.3 %   MCV 90.1  78.0 - 100.0 fL   MCH 31.2  26.0 - 34.0 pg   MCHC 34.7  30.0 - 36.0 g/dL   RDW 08.6  57.8 - 46.9 %   Platelets 289  150 - 400 K/uL  ABO/RH     Status: None   Collection Time    08/29/13  9:25 PM      Result Value Ref Range   ABO/RH(D) A POS    HCG, QUANTITATIVE, PREGNANCY     Status: Abnormal   Collection Time    08/29/13  9:25 PM      Result Value  Ref Range   hCG, Beta Chain, Quant, S 33449 (*) <5 mIU/mL   US Ob Comp Less 14 Wks  08/29/2013   CLINICAL DATA:  Pain. Vaginal bleeding. Pregnant, 6 weeks and 2 days based on the last menstrual period. Beta HCG is pending.  EXAM: OBSTETRIC <14 WK [place] AND TRANSVAGINAL OB US  TECHNIQUE: Both transabdominal and transvaginal ultrasound examinations were performed for complete evaluation of the gestation as well as the maternal uterus, adnexal regions, and pelvic cul-de-sac. Transvaginal technique was performed to assess early pregnancy.  COMPARISON:  None.  FINDINGS: Intrauterine gestational sac: Visualized. Somewhat elongated but otherwise unremarkable.  Yolk sac:  Visualized  Embryo:  Yes  Cardiac Activity: Yes  Heart Rate:  96 bpm  CRL:   2.4  mm   5 w 6 d                  [place] EDC: 04/25/2014  Maternal uterus/adnexae: No subchronic hemorrhage. No uterine mass. Normal ovaries. No adnexal masses. No free fluid.  IMPRESSION: 1. Single live intrauterine pregnancy with a measured gestational age of [redacted] weeks and 6 days, which is concordant with the gestational age based on the last menstrual period. 2. No emergent pregnancy or maternal complication. Normal ovaries and adnexa. No free fluid.   Electronically Signed   By: Amie Portland M.D.   On: 08/29/2013 21:59   US Ob Transvaginal  08/29/2013   CLINICAL DATA:  Pain. Vaginal bleeding. Pregnant, 6 weeks and 2 days based on the last menstrual period. Beta HCG is pending.  EXAM: OBSTETRIC <14 WK [place] AND TRANSVAGINAL OB US  TECHNIQUE: Both transabdominal and transvaginal ultrasound examinations were performed for complete evaluation of the gestation as well as the maternal uterus, adnexal regions, and pelvic cul-de-sac. Transvaginal technique was performed to assess early pregnancy.  COMPARISON:  None.  FINDINGS: Intrauterine gestational sac: Visualized. Somewhat elongated but otherwise unremarkable.  Yolk sac:  Visualized  Embryo:  Yes  Cardiac Activity: Yes  Heart Rate:  96  bpm  CRL:  2.4  mm   5 w 6 d                  [place] EDC: 04/25/2014  Maternal uterus/adnexae: No subchronic hemorrhage. No uterine mass. Normal ovaries. No adnexal masses. No free fluid.  IMPRESSION: 1. Single live intrauterine pregnancy with a measured gestational age of [redacted] weeks and 6 days, which is concordant with the gestational age based on the last menstrual period. 2. No emergent pregnancy or maternal complication. Normal ovaries and adnexa. No free fluid.   Electronically Signed   By: Amie Portland M.D.   On: 08/29/2013 21:59   2233: D/W Dr. Normand Sloop, and reviewed labs and [place]. Ok for DC home and to make a new OB appointment with [facility]  Assessment and Plan   1. Spotting complicating pregnancy in first trimester    Bleeding precautions reviewed Return to MAU as needed  Follow-up Information   Schedule an appointment as soon as possible for a visit with [facility].   Contact information:   [address] [place] [address] [phone]       Tawnya Crook 08/29/2013, 10:19 PM

## 2013-08-29 NOTE — Discharge Instructions (Signed)
Vaginal Bleeding During Pregnancy, First Trimester °A small amount of bleeding (spotting) from the vagina is relatively common in early pregnancy. It usually stops on its own. Various things may cause bleeding or spotting in early pregnancy. Some bleeding may be related to the pregnancy, and some may not. In most cases, the bleeding is normal and is not a problem. However, bleeding can also be a sign of something serious. Be sure to tell your health care provider about any vaginal bleeding right away. °Some possible causes of vaginal bleeding during the first trimester include: °· Infection or inflammation of the cervix. °· Growths (polyps) on the cervix. °· Miscarriage or threatened miscarriage. °· Pregnancy tissue has developed outside of the uterus and in a fallopian tube (tubal pregnancy). °· Tiny cysts have developed in the uterus instead of pregnancy tissue (molar pregnancy). °HOME CARE INSTRUCTIONS  °Watch your condition for any changes. The following actions may help to lessen any discomfort you are feeling: °· Follow your health care provider's instructions for limiting your activity. If your health care provider orders bed rest, you may need to stay in bed and only get up to use the bathroom. However, your health care provider may allow you to continue light activity. °· If needed, make plans for someone to help with your regular activities and responsibilities while you are on bed rest. °· Keep track of the number of pads you use each day, how often you change pads, and how soaked (saturated) they are. Write this down. °· Do not use tampons. Do not douche. °· Do not have sexual intercourse or orgasms until approved by your health care provider. °· If you pass any tissue from your vagina, save the tissue so you can show it to your health care provider. °· Only take over-the-counter or prescription medicines as directed by your health care provider. °· Do not take aspirin because it can make you  bleed. °· Keep all follow-up appointments as directed by your health care provider. °SEEK MEDICAL CARE IF: °· You have any vaginal bleeding during any part of your pregnancy. °· You have cramps or labor pains. °SEEK IMMEDIATE MEDICAL CARE IF:  °· You have severe cramps in your back or belly (abdomen). °· You have a fever, not controlled by medicine. °· You pass large clots or tissue from your vagina. °· Your bleeding increases. °· You feel lightheaded or weak, or you have fainting episodes. °· You have chills. °· You are leaking fluid or have a gush of fluid from your vagina. °· You pass out while having a bowel movement. °MAKE SURE YOU: °· Understand these instructions. °· Will watch your condition. °· Will get help right away if you are not doing well or get worse. °Document Released: 02/19/2005 Document Revised: 03/02/2013 Document Reviewed: 01/17/2013 °ExitCare® Patient Information ©2014 [employer]. ° °

## 2013-08-30 LAB — ABO/RH: ABO/RH(D): A POS

## 2013-09-16 ENCOUNTER — Encounter (HOSPITAL_COMMUNITY): Payer: Self-pay | Admitting: Emergency Medicine

## 2013-09-16 ENCOUNTER — Emergency Department (HOSPITAL_COMMUNITY)
Admission: EM | Admit: 2013-09-16 | Discharge: 2013-09-16 | Disposition: A | Discharge status: Home / Self Care | Payer: Medicaid Other | Attending: Emergency Medicine | Admitting: Emergency Medicine

## 2013-09-16 DIAGNOSIS — Z791 Long term (current) use of non-steroidal anti-inflammatories (NSAID): Secondary | ICD-10-CM | POA: Insufficient documentation

## 2013-09-16 DIAGNOSIS — W503XXA Accidental bite by another person, initial encounter: Secondary | ICD-10-CM

## 2013-09-16 DIAGNOSIS — D649 Anemia, unspecified: Secondary | ICD-10-CM | POA: Insufficient documentation

## 2013-09-16 DIAGNOSIS — S41009A Unspecified open wound of unspecified shoulder, initial encounter: Secondary | ICD-10-CM | POA: Insufficient documentation

## 2013-09-16 DIAGNOSIS — Z8619 Personal history of other infectious and parasitic diseases: Secondary | ICD-10-CM | POA: Insufficient documentation

## 2013-09-16 DIAGNOSIS — F17200 Nicotine dependence, unspecified, uncomplicated: Secondary | ICD-10-CM | POA: Insufficient documentation

## 2013-09-16 DIAGNOSIS — O9989 Other specified diseases and conditions complicating pregnancy, childbirth and the puerperium: Secondary | ICD-10-CM | POA: Insufficient documentation

## 2013-09-16 DIAGNOSIS — Z79899 Other long term (current) drug therapy: Secondary | ICD-10-CM | POA: Insufficient documentation

## 2013-09-16 DIAGNOSIS — S41109A Unspecified open wound of unspecified upper arm, initial encounter: Secondary | ICD-10-CM | POA: Insufficient documentation

## 2013-09-16 DIAGNOSIS — Z23 Encounter for immunization: Secondary | ICD-10-CM | POA: Insufficient documentation

## 2013-09-16 MED ORDER — TETANUS-DIPHTH-ACELL PERTUSSIS 5-2.5-18.5 LF-MCG/0.5 IM SUSP
0.5000 mL | Freq: Once | INTRAMUSCULAR | Status: AC
Start: 2013-09-16 — End: 2013-09-16
  Administered 2013-09-16: 0.5 mL via INTRAMUSCULAR
  Filled 2013-09-16: qty 0.5

## 2013-09-16 MED ORDER — AMOXICILLIN-POT CLAVULANATE 875-125 MG PO TABS: 1.0000 | ORAL_TABLET | Freq: Once | ORAL | Status: DC

## 2013-09-16 MED ORDER — AMOXICILLIN-POT CLAVULANATE 875-125 MG PO TABS
1.0000 | ORAL_TABLET | Freq: Once | ORAL | Status: AC
  Administered 2013-09-16: 1 via ORAL
  Filled 2013-09-16: qty 1
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
## 2013-09-16 NOTE — Discharge Instructions (Signed)
Human Bite  Human bite wounds tend to become infected, even when they seem minor at first. Bite wounds of the hand can be serious because the tendons and joints are close to the skin. Infection can develop very rapidly, even in a matter of hours.   DIAGNOSIS   Your caregiver will most likely:  · Take a detailed history of the bite injury.  · Perform a wound exam.  · Take your medical history.  Blood tests or X-rays may be performed. Sometimes, infected bite wounds are cultured and sent to a lab to identify the infectious bacteria.  TREATMENT   Medical treatment will depend on the location of the bite as well as the patient's medical history. Treatment may include:  · Wound care, such as cleaning and flushing the wound with saline solution, bandaging, and elevating the affected area.  · Antibiotic medicine.  · Tetanus immunization.  · Leaving the wound open to heal. This is often done with human bites due to the high risk of infection. However, in certain cases, wound closure with stitches, wound adhesive, skin adhesive strips, or staples may be used.  Infected bites that are left untreated may require intravenous (IV) antibiotics and surgical treatment in the hospital.  HOME CARE INSTRUCTIONS  · Follow your caregiver's instructions for wound care.  · Take all medicines as directed.  · If your caregiver prescribes antibiotics, take them as directed. Finish them even if you start to feel better.  · Follow up with your caregiver for further exams or immunizations as directed.  You may need a tetanus shot if:  · You cannot remember when you had your last tetanus shot.  · You have never had a tetanus shot.  · The injury broke your skin.  If you get a tetanus shot, your arm may swell, get red, and feel warm to the touch. This is common and not a problem. If you need a tetanus shot and you choose not to have one, there is a rare chance of getting tetanus. Sickness from tetanus can be serious.  SEEK IMMEDIATE MEDICAL CARE  IF:  · You have increased pain, swelling, or redness around the bite wound.  · You have chills.  · You have a fever.  · You have pus draining from the wound.  · You have red streaks on the skin coming from the wound.  · You have pain with movement or trouble moving the injured part.  · You are not improving, or you are getting worse.  · You have any other questions or concerns.  MAKE SURE YOU:  · Understand these instructions.  · Will watch your condition.  · Will get help right away if you are not doing well or get worse.  Document Released: 06/19/2004 Document Revised: 08/04/2011 Document Reviewed: 01/01/2011  ExitCare® Patient Information ©2014 [employer].
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
## 2013-09-19 NOTE — ED Provider Notes (Signed)
Medical screening examination/treatment/procedure(s) were performed by non-physician practitioner and as supervising physician I was immediately available for consultation/collaboration.   EKG Interpretation None        Audree CamelScott T Chasidy Janak, MD 09/19/13 [phone]

## 2013-11-18 ENCOUNTER — Other Ambulatory Visit: Payer: Self-pay | Admitting: Obstetrics and Gynecology

## 2013-11-18 DIAGNOSIS — Z3689 Encounter for other specified antenatal screening: Secondary | ICD-10-CM

## 2013-11-21 ENCOUNTER — Ambulatory Visit (HOSPITAL_COMMUNITY)
Admission: RE | Admit: 2013-11-21 | Discharge: 2013-11-21 | Disposition: A | Discharge status: Home / Self Care | Payer: Medicaid Other | Source: Ambulatory Visit | Attending: Obstetrics and Gynecology | Admitting: Obstetrics and Gynecology

## 2013-11-21 DIAGNOSIS — Z3689 Encounter for other specified antenatal screening: Secondary | ICD-10-CM | POA: Insufficient documentation

## 2014-03-04 ENCOUNTER — Inpatient Hospital Stay (HOSPITAL_COMMUNITY)
Admission: AD | Admit: 2014-03-04 | Discharge: 2014-03-05 | Disposition: A | Discharge status: Home / Self Care | Payer: Medicaid Other | Source: Ambulatory Visit | Attending: Obstetrics and Gynecology | Admitting: Obstetrics and Gynecology

## 2014-03-04 DIAGNOSIS — O09523 Supervision of elderly multigravida, third trimester: Secondary | ICD-10-CM | POA: Insufficient documentation

## 2014-03-04 DIAGNOSIS — Z3A33 33 weeks gestation of pregnancy: Secondary | ICD-10-CM | POA: Insufficient documentation

## 2014-03-04 DIAGNOSIS — O133 Gestational [pregnancy-induced] hypertension without significant proteinuria, third trimester: Secondary | ICD-10-CM

## 2014-03-04 DIAGNOSIS — F17210 Nicotine dependence, cigarettes, uncomplicated: Secondary | ICD-10-CM | POA: Insufficient documentation

## 2014-03-05 ENCOUNTER — Encounter (HOSPITAL_COMMUNITY): Payer: Self-pay

## 2014-03-05 DIAGNOSIS — O09523 Supervision of elderly multigravida, third trimester: Secondary | ICD-10-CM | POA: Diagnosis not present

## 2014-03-05 DIAGNOSIS — Z3A33 33 weeks gestation of pregnancy: Secondary | ICD-10-CM | POA: Diagnosis not present

## 2014-03-05 DIAGNOSIS — F17210 Nicotine dependence, cigarettes, uncomplicated: Secondary | ICD-10-CM | POA: Diagnosis not present

## 2014-03-05 DIAGNOSIS — O133 Gestational [pregnancy-induced] hypertension without significant proteinuria, third trimester: Secondary | ICD-10-CM | POA: Diagnosis not present

## 2014-03-05 DIAGNOSIS — N898 Other specified noninflammatory disorders of vagina: Secondary | ICD-10-CM | POA: Diagnosis present

## 2014-03-05 LAB — COMPREHENSIVE METABOLIC PANEL
ALT: 14 U/L (ref 0–35)
AST: 14 U/L (ref 0–37)
Albumin: 2.7 g/dL — ABNORMAL LOW (ref 3.5–5.2)
Alkaline Phosphatase: 90 U/L (ref 39–117)
Anion gap: 14 (ref 5–15)
BILIRUBIN TOTAL: 0.3 mg/dL (ref 0.3–1.2)
BUN: 9 mg/dL (ref 6–23)
CALCIUM: 8.9 mg/dL (ref 8.4–10.5)
CHLORIDE: 101 meq/L (ref 96–112)
CO2: 20 mEq/L (ref 19–32)
Creatinine, Ser: 0.62 mg/dL (ref 0.50–1.10)
GFR calc Af Amer: >90 mL/min (ref >90)
Glucose, Bld: 115 mg/dL — ABNORMAL HIGH (ref 70–99)
Potassium: 3.8 mEq/L (ref 3.7–5.3)
SODIUM: 135 meq/L — AB (ref 137–147)
Total Protein: 6.4 g/dL (ref 6.0–8.3)

## 2014-03-05 LAB — CBC
HCT: 36.8 % (ref 36.0–46.0)
Hemoglobin: 12.6 g/dL (ref 12.0–15.0)
MCH: 31.8 pg (ref 26.0–34.0)
MCHC: 34.2 g/dL (ref 30.0–36.0)
MCV: 92.9 fL (ref 78.0–100.0)
PLATELETS: 241 10*3/uL (ref 150–400)
RBC: 3.96 MIL/uL (ref 3.87–5.11)
RDW: 13.2 % (ref 11.5–15.5)
WBC: 11.7 10*3/uL — AB (ref 4.0–10.5)

## 2014-03-05 LAB — URINALYSIS, ROUTINE W REFLEX MICROSCOPIC
Bilirubin Urine: NEGATIVE
GLUCOSE, UA: NEGATIVE mg/dL
HGB URINE DIPSTICK: NEGATIVE
Ketones, ur: NEGATIVE mg/dL
Nitrite: NEGATIVE
PH: 6 (ref 5.0–8.0)
PROTEIN: NEGATIVE mg/dL
Specific Gravity, Urine: 1.025 (ref 1.005–1.030)
Urobilinogen, UA: 0.2 mg/dL (ref 0.0–1.0)

## 2014-03-05 LAB — WET PREP, GENITAL
Clue Cells Wet Prep HPF POC: NONE SEEN
Trich, Wet Prep: NONE SEEN
Yeast Wet Prep HPF POC: NONE SEEN

## 2014-03-05 LAB — PROTEIN / CREATININE RATIO, URINE
CREATININE, URINE: 131.66 mg/dL
Protein Creatinine Ratio: 0.2 — ABNORMAL HIGH (ref 0.00–0.15)
TOTAL PROTEIN, URINE: 25.9 mg/dL

## 2014-03-05 LAB — URINE MICROSCOPIC-ADD ON

## 2014-03-05 NOTE — MAU Provider Note (Signed)
History     CSN: [id number]  Arrival date and time: 03/04/14 2334   First Provider Initiated Contact with Patient 03/05/14 0047      Chief Complaint  Patient presents with  . Rupture of Membranes   HPI  Pt is 38 yo G6P1041 at 2812w1d wks IUP here with report of vaginal discharge in underwear at 2230 today.  Also felt "two streams" while urinating.  Denies vaginal bleeding or contractions.  Denies problems in pregnancy.  +fetal movement.    Past Medical History  Diagnosis Date  . Seropositive for herpes simplex 2 infection   . Blood transfusion without reported diagnosis 1997    anemia  . Anemia   . Vaginal Pap smear, abnormal     Past Surgical History  Procedure Laterality Date  . Fracture surgery    . Abortions    . Cesarean section      History reviewed. No pertinent family history.  History  Substance Use Topics  . Smoking status: Current Every Day Smoker -- 1.00 packs/day    Types: Cigarettes  . Smokeless tobacco: Never Used  . Alcohol Use: Yes     Comment: socially    Allergies: No Known Allergies  Prescriptions prior to admission  Medication Sig Dispense Refill  . Prenatal Vit-Fe Fumarate-FA (PRENATAL MULTIVITAMIN) TABS tablet Take 1 tablet by mouth daily at 12 noon.      . ValACYclovir HCl (VALTREX PO) Take 1 tablet by mouth daily.      Marland Kitchen. amoxicillin-clavulanate (AUGMENTIN) 875-125 MG per tablet Take 1 tablet by mouth once.  20 tablet  0  . cholecalciferol (VITAMIN D) 1000 UNITS tablet Take 1,000 Units by mouth daily.      Marland Kitchen. ibuprofen (ADVIL,MOTRIN) 200 MG tablet Take 400 mg by mouth every 6 (six) hours as needed. pain      . Iron TABS Take 1 tablet by mouth daily.         Review of Systems  Eyes: Negative for blurred vision and double vision.  Gastrointestinal: Negative for abdominal pain.  Genitourinary: Negative for dysuria.       Vaginal discharge  Neurological: Negative for headaches.  All other systems reviewed and are negative.  Physical  Exam   Blood pressure 152/89, pulse 85, temperature 98.5 F (36.9 C), resp. rate 18, height 5' 3.5" (1.613 m), weight 103.148 kg (227 lb 6.4 oz), last menstrual period 07/16/2013. Filed Vitals:   03/05/14 0002 03/05/14 0042 03/05/14 0044 03/05/14 0049  BP: 140/81 157/89 152/89 143/91  Pulse: 83 87 85 83  Temp: 98.5 F (36.9 C)     Resp: 18     Height: 5' 3.5" (1.613 m)     Weight: 103.148 kg (227 lb 6.4 oz)       Physical Exam  Constitutional: She is oriented to person, place, and time. She appears well-developed and well-nourished. No distress.  HENT:  Head: Normocephalic.  Neck: Normal range of motion. Neck supple.  Cardiovascular: Normal rate, regular rhythm and normal heart sounds.   Respiratory: Effort normal and breath sounds normal.  GI: Soft. There is no tenderness.  Genitourinary: No bleeding around the vagina. Vaginal discharge (thick, white discharge) found.  Negative pooling, neg fern  Neurological: She is alert and oriented to person, place, and time.  Skin: Skin is warm and dry.   Dilation: Closed Exam by:: Margarita MailW Karim CNM  FHR 130's, +accels Toco - irregular MAU Course  Procedures  Results for orders placed during the hospital encounter of  03/04/14 (from the past 24 hour(s))  URINALYSIS, ROUTINE W REFLEX MICROSCOPIC     Status: Abnormal   Collection Time    03/05/14 12:10 AM      Result Value Ref Range   Color, Urine YELLOW  YELLOW   APPearance CLEAR  CLEAR   Specific Gravity, Urine 1.025  1.005 - 1.030   pH 6.0  5.0 - 8.0   Glucose, UA NEGATIVE  NEGATIVE mg/dL   Hgb urine dipstick NEGATIVE  NEGATIVE   Bilirubin Urine NEGATIVE  NEGATIVE   Ketones, ur NEGATIVE  NEGATIVE mg/dL   Protein, ur NEGATIVE  NEGATIVE mg/dL   Urobilinogen, UA 0.2  0.0 - 1.0 mg/dL   Nitrite NEGATIVE  NEGATIVE   Leukocytes, UA TRACE (*) NEGATIVE  URINE MICROSCOPIC-ADD ON     Status: Abnormal   Collection Time    03/05/14 12:10 AM      Result Value Ref Range   Squamous Epithelial  / LPF RARE  RARE   WBC, UA 3-6  <3 WBC/hpf   RBC / HPF 3-6  <3 RBC/hpf   Bacteria, UA FEW (*) RARE   Crystals CA OXALATE CRYSTALS (*) NEGATIVE  WET PREP, GENITAL     Status: Abnormal   Collection Time    03/05/14 12:52 AM      Result Value Ref Range   Yeast Wet Prep HPF POC NONE SEEN  NONE SEEN   Trich, Wet Prep NONE SEEN  NONE SEEN   Clue Cells Wet Prep HPF POC NONE SEEN  NONE SEEN   WBC, Wet Prep HPF POC MODERATE (*) NONE SEEN  CBC     Status: Abnormal   Collection Time    03/05/14  1:05 AM      Result Value Ref Range   WBC 11.7 (*) 4.0 - 10.5 K/uL   RBC 3.96  3.87 - 5.11 MIL/uL   Hemoglobin 12.6  12.0 - 15.0 g/dL   HCT 16.1  09.6 - 04.5 %   MCV 92.9  78.0 - 100.0 fL   MCH 31.8  26.0 - 34.0 pg   MCHC 34.2  30.0 - 36.0 g/dL   RDW 40.9  81.1 - 91.4 %   Platelets 241  150 - 400 K/uL  COMPREHENSIVE METABOLIC PANEL     Status: Abnormal   Collection Time    03/05/14  1:05 AM      Result Value Ref Range   Sodium 135 (*) 137 - 147 mEq/L   Potassium 3.8  3.7 - 5.3 mEq/L   Chloride 101  96 - 112 mEq/L   CO2 20  19 - 32 mEq/L   Glucose, Bld 115 (*) 70 - 99 mg/dL   BUN 9  6 - 23 mg/dL   Creatinine, Ser 7.82  0.50 - 1.10 mg/dL   Calcium 8.9  8.4 - 95.6 mg/dL   Total Protein 6.4  6.0 - 8.3 g/dL   Albumin 2.7 (*) 3.5 - 5.2 g/dL   AST 14  0 - 37 U/L   ALT 14  0 - 35 U/L   Alkaline Phosphatase 90  39 - 117 U/L   Total Bilirubin 0.3  0.3 - 1.2 mg/dL   GFR calc non Af Amer >90  >90 mL/min   GFR calc Af Amer >90  >90 mL/min   Anion gap 14  5 - 15   0205 Consulted with Dr. Sallye Ober > reviewed HPI/exam/med hx/labs/fetal tracing > discharge home with 24 hr urine collection and follow-up in office  Monday.    Assessment and Plan  38 yo G6P1041 at 3031w5d wks IUP Gestational Hypertension Category I FHR Tracing  Plan: Discharge to home Preeclampsia precautions Sent home with 24 hour collection Call [facility] on Monday to ask where to drop off 24 hr urine and for appt for blood pressure  check  KARIM, [facility] PartnershipWALIDAH N 03/05/2014, 12:53 AM

## 2014-03-05 NOTE — Discharge Instructions (Signed)

## 2014-03-05 NOTE — MAU Note (Signed)
[facility] and noticed wet spot on panties and had funny odor - not like urine. Some "muscle" pain in lower abd. (points to groin area).

## 2014-03-06 ENCOUNTER — Ambulatory Visit (HOSPITAL_COMMUNITY)
Admission: AD | Admit: 2014-03-06 | Discharge: 2014-03-06 | Disposition: A | Discharge status: Home / Self Care | Payer: Medicaid Other | Source: Ambulatory Visit | Attending: Obstetrics and Gynecology | Admitting: Obstetrics and Gynecology

## 2014-03-06 DIAGNOSIS — O133 Gestational [pregnancy-induced] hypertension without significant proteinuria, third trimester: Secondary | ICD-10-CM | POA: Insufficient documentation

## 2014-03-06 LAB — PROTEIN, URINE, 24 HOUR
Collection Interval-UPROT: 24 hours
PROTEIN, URINE: 15 mg/dL
Protein, 24H Urine: 158 mg/d — ABNORMAL HIGH (ref 50–100)
Urine Total Volume-UPROT: 1050 mL

## 2014-03-06 LAB — CREATININE CLEARANCE, URINE, 24 HOUR
CREATININE: 0.62 mg/dL (ref 0.50–1.10)
Collection Interval-CRCL: 24 hours
Creatinine Clearance: 146 mL/min — ABNORMAL HIGH (ref 75–115)
Creatinine, 24H Ur: 1307 mg/d (ref 700–1800)
Creatinine, Urine: 124.46 mg/dL
Urine Total Volume-CRCL: 1050 mL

## 2014-03-06 NOTE — MAU Note (Signed)
Dr. Dion BodyVarnado paged and notified pt needs outpt 24 hour urine orders placed.

## 2014-03-23 NOTE — MAU Provider Note (Signed)
Attestation of Attending Supervision of Advanced Practitioner (PA/CNM/NP): Evaluation and management procedures were performed by the Advanced Practitioner under my supervision and collaboration.  I have reviewed the Advanced Practitioner's note and chart, and I agree with the management and plan.  Latacha Texeira, DO Attending Physician Faculty Practice, [facility]  

## 2014-03-27 ENCOUNTER — Encounter (HOSPITAL_COMMUNITY): Payer: Self-pay

## 2014-04-07 ENCOUNTER — Encounter (HOSPITAL_COMMUNITY): Payer: Self-pay | Admitting: *Deleted

## 2014-04-07 ENCOUNTER — Inpatient Hospital Stay (HOSPITAL_COMMUNITY): Payer: Medicaid Other | Admitting: Anesthesiology

## 2014-04-07 ENCOUNTER — Encounter (HOSPITAL_COMMUNITY)
Admission: AD | Disposition: A | Discharge status: Home / Self Care | Payer: Self-pay | Source: Ambulatory Visit | Attending: Obstetrics and Gynecology

## 2014-04-07 ENCOUNTER — Inpatient Hospital Stay (HOSPITAL_COMMUNITY)
Admission: AD | Admit: 2014-04-07 | Discharge: 2014-04-10 | DRG: 765 | Disposition: A | Discharge status: Home / Self Care | Payer: Medicaid Other | Source: Ambulatory Visit | Attending: Obstetrics and Gynecology | Admitting: Obstetrics and Gynecology

## 2014-04-07 DIAGNOSIS — Z302 Encounter for sterilization: Secondary | ICD-10-CM | POA: Diagnosis not present

## 2014-04-07 DIAGNOSIS — O4292 Full-term premature rupture of membranes, unspecified as to length of time between rupture and onset of labor: Secondary | ICD-10-CM | POA: Diagnosis present

## 2014-04-07 DIAGNOSIS — O98313 Other infections with a predominantly sexual mode of transmission complicating pregnancy, third trimester: Secondary | ICD-10-CM | POA: Diagnosis present

## 2014-04-07 DIAGNOSIS — Z87891 Personal history of nicotine dependence: Secondary | ICD-10-CM

## 2014-04-07 DIAGNOSIS — Z6841 Body Mass Index (BMI) 40.0 and over, adult: Secondary | ICD-10-CM

## 2014-04-07 DIAGNOSIS — Z3A37 37 weeks gestation of pregnancy: Secondary | ICD-10-CM | POA: Diagnosis present

## 2014-04-07 DIAGNOSIS — O09523 Supervision of elderly multigravida, third trimester: Secondary | ICD-10-CM | POA: Diagnosis not present

## 2014-04-07 DIAGNOSIS — O3421 Maternal care for scar from previous cesarean delivery: Principal | ICD-10-CM | POA: Diagnosis present

## 2014-04-07 DIAGNOSIS — Z98891 History of uterine scar from previous surgery: Secondary | ICD-10-CM

## 2014-04-07 DIAGNOSIS — A6000 Herpesviral infection of urogenital system, unspecified: Secondary | ICD-10-CM | POA: Diagnosis present

## 2014-04-07 DIAGNOSIS — O99214 Obesity complicating childbirth: Secondary | ICD-10-CM | POA: Diagnosis present

## 2014-04-07 DIAGNOSIS — Z3483 Encounter for supervision of other normal pregnancy, third trimester: Secondary | ICD-10-CM | POA: Diagnosis present

## 2014-04-07 DIAGNOSIS — A6004 Herpesviral vulvovaginitis: Secondary | ICD-10-CM | POA: Diagnosis present

## 2014-04-07 LAB — CBC
HEMATOCRIT: 37 % (ref 36.0–46.0)
Hemoglobin: 12.7 g/dL (ref 12.0–15.0)
MCH: 31.8 pg (ref 26.0–34.0)
MCHC: 34.3 g/dL (ref 30.0–36.0)
MCV: 92.5 fL (ref 78.0–100.0)
Platelets: 234 10*3/uL (ref 150–400)
RBC: 4 MIL/uL (ref 3.87–5.11)
RDW: 13.7 % (ref 11.5–15.5)
WBC: 9.9 10*3/uL (ref 4.0–10.5)

## 2014-04-07 LAB — PROTEIN / CREATININE RATIO, URINE
Creatinine, Urine: 81.39 mg/dL
PROTEIN CREATININE RATIO: 0.22 — AB (ref 0.00–0.15)
TOTAL PROTEIN, URINE: 17.8 mg/dL

## 2014-04-07 LAB — COMPREHENSIVE METABOLIC PANEL
ALBUMIN: 2.4 g/dL — AB (ref 3.5–5.2)
ALK PHOS: 105 U/L (ref 39–117)
ALT: 13 U/L (ref 0–35)
AST: 14 U/L (ref 0–37)
Anion gap: 13 (ref 5–15)
BILIRUBIN TOTAL: 0.4 mg/dL (ref 0.3–1.2)
BUN: 7 mg/dL (ref 6–23)
CHLORIDE: 102 meq/L (ref 96–112)
CO2: 19 mEq/L (ref 19–32)
Calcium: 9.2 mg/dL (ref 8.4–10.5)
Creatinine, Ser: 0.62 mg/dL (ref 0.50–1.10)
GFR calc Af Amer: >90 mL/min (ref >90)
GFR calc non Af Amer: >90 mL/min (ref >90)
Glucose, Bld: 143 mg/dL — ABNORMAL HIGH (ref 70–99)
POTASSIUM: 3.9 meq/L (ref 3.7–5.3)
SODIUM: 134 meq/L — AB (ref 137–147)
TOTAL PROTEIN: 5.7 g/dL — AB (ref 6.0–8.3)

## 2014-04-07 LAB — URINALYSIS, ROUTINE W REFLEX MICROSCOPIC
Bilirubin Urine: NEGATIVE
GLUCOSE, UA: NEGATIVE mg/dL
KETONES UR: 15 mg/dL — AB
Leukocytes, UA: NEGATIVE
NITRITE: NEGATIVE
PH: 6 (ref 5.0–8.0)
Protein, ur: NEGATIVE mg/dL
SPECIFIC GRAVITY, URINE: 1.025 (ref 1.005–1.030)
Urobilinogen, UA: 0.2 mg/dL (ref 0.0–1.0)

## 2014-04-07 LAB — TYPE AND SCREEN
ABO/RH(D): A POS
Antibody Screen: NEGATIVE

## 2014-04-07 LAB — URINE MICROSCOPIC-ADD ON

## 2014-04-07 LAB — RPR

## 2014-04-07 LAB — AMNISURE RUPTURE OF MEMBRANE (ROM) NOT AT ARMC: AMNISURE: POSITIVE

## 2014-04-07 SURGERY — Surgical Case
Anesthesia: Spinal | Site: Abdomen

## 2014-04-07 MED ORDER — MENTHOL 3 MG MT LOZG: 1.0000 | LOZENGE | OROMUCOSAL | Status: DC | PRN

## 2014-04-07 MED ORDER — CEFAZOLIN SODIUM-DEXTROSE 2-3 GM-% IV SOLR
2.0000 g | INTRAVENOUS | Status: AC
  Administered 2014-04-07: 2 g via INTRAVENOUS
  Filled 2014-04-07: qty 50

## 2014-04-07 MED ORDER — MORPHINE SULFATE 0.5 MG/ML IJ SOLN
INTRAMUSCULAR | Status: AC
  Filled 2014-04-07: qty 10

## 2014-04-07 MED ORDER — KETOROLAC TROMETHAMINE 30 MG/ML IJ SOLN
30.0000 mg | Freq: Four times a day (QID) | INTRAMUSCULAR | Status: DC | PRN
  Administered 2014-04-07: 30 mg via INTRAVENOUS

## 2014-04-07 MED ORDER — NALOXONE HCL 1 MG/ML IJ SOLN: 1.0000 ug/kg/h | INTRAVENOUS | Status: DC | PRN

## 2014-04-07 MED ORDER — KETOROLAC TROMETHAMINE 30 MG/ML IJ SOLN: 15.0000 mg | Freq: Once | INTRAMUSCULAR | Status: DC | PRN

## 2014-04-07 MED ORDER — LACTATED RINGERS IV SOLN
INTRAVENOUS | Status: DC
  Administered 2014-04-07: 23:00:00 via INTRAVENOUS

## 2014-04-07 MED ORDER — OXYCODONE-ACETAMINOPHEN 5-325 MG PO TABS
2.0000 | ORAL_TABLET | ORAL | Status: DC | PRN
  Administered 2014-04-09 – 2014-04-10 (×3): 2 via ORAL
  Filled 2014-04-07 (×3): qty 2

## 2014-04-07 MED ORDER — OXYCODONE-ACETAMINOPHEN 5-325 MG PO TABS
1.0000 | ORAL_TABLET | ORAL | Status: DC | PRN
  Administered 2014-04-08 – 2014-04-09 (×3): 1 via ORAL
  Filled 2014-04-07 (×4): qty 1

## 2014-04-07 MED ORDER — OXYTOCIN 10 UNIT/ML IJ SOLN
INTRAMUSCULAR | Status: AC
  Filled 2014-04-07: qty 4

## 2014-04-07 MED ORDER — OXYTOCIN 10 UNIT/ML IJ SOLN
40.0000 [IU] | INTRAVENOUS | Status: DC | PRN
  Administered 2014-04-07: 40 [IU] via INTRAVENOUS

## 2014-04-07 MED ORDER — SODIUM CHLORIDE 0.9 % IJ SOLN: 3.0000 mL | INTRAMUSCULAR | Status: DC | PRN

## 2014-04-07 MED ORDER — ONDANSETRON HCL 4 MG/2ML IJ SOLN
INTRAMUSCULAR | Status: AC
  Filled 2014-04-07: qty 2

## 2014-04-07 MED ORDER — MEPERIDINE HCL 25 MG/ML IJ SOLN: 6.2500 mg | INTRAMUSCULAR | Status: DC | PRN

## 2014-04-07 MED ORDER — CITRIC ACID-SODIUM CITRATE 334-500 MG/5ML PO SOLN
30.0000 mL | Freq: Once | ORAL | Status: AC
  Administered 2014-04-07: 30 mL via ORAL
  Filled 2014-04-07: qty 15

## 2014-04-07 MED ORDER — DIBUCAINE 1 % RE OINT
1.0000 | TOPICAL_OINTMENT | RECTAL | Status: DC | PRN
Start: 2014-04-07 — End: 2014-04-10

## 2014-04-07 MED ORDER — PRENATAL MULTIVITAMIN CH
1.0000 | ORAL_TABLET | Freq: Every day | ORAL | Status: DC
  Administered 2014-04-08 – 2014-04-09 (×2): 1 via ORAL
  Filled 2014-04-07 (×2): qty 1

## 2014-04-07 MED ORDER — ONDANSETRON HCL 4 MG/2ML IJ SOLN: 4.0000 mg | Freq: Three times a day (TID) | INTRAMUSCULAR | Status: DC | PRN

## 2014-04-07 MED ORDER — LACTATED RINGERS IV BOLUS (SEPSIS): 1000.0000 mL | Freq: Once | INTRAVENOUS | Status: DC

## 2014-04-07 MED ORDER — NALBUPHINE HCL 10 MG/ML IJ SOLN: 5.0000 mg | INTRAMUSCULAR | Status: DC | PRN

## 2014-04-07 MED ORDER — IBUPROFEN 600 MG PO TABS
600.0000 mg | ORAL_TABLET | Freq: Four times a day (QID) | ORAL | Status: DC
  Administered 2014-04-08 – 2014-04-10 (×9): 600 mg via ORAL
  Filled 2014-04-07 (×9): qty 1

## 2014-04-07 MED ORDER — SCOPOLAMINE 1 MG/3DAYS TD PT72
MEDICATED_PATCH | TRANSDERMAL | Status: AC
  Filled 2014-04-07: qty 1

## 2014-04-07 MED ORDER — DIPHENHYDRAMINE HCL 25 MG PO CAPS: 25.0000 mg | ORAL_CAPSULE | Freq: Four times a day (QID) | ORAL | Status: DC | PRN

## 2014-04-07 MED ORDER — PHENYLEPHRINE 40 MCG/ML (10ML) SYRINGE FOR IV PUSH (FOR BLOOD PRESSURE SUPPORT)
PREFILLED_SYRINGE | INTRAVENOUS | Status: AC
  Filled 2014-04-07: qty 5

## 2014-04-07 MED ORDER — SENNOSIDES-DOCUSATE SODIUM 8.6-50 MG PO TABS
2.0000 | ORAL_TABLET | ORAL | Status: DC
  Administered 2014-04-07 – 2014-04-09 (×3): 2 via ORAL
  Filled 2014-04-07 (×3): qty 2

## 2014-04-07 MED ORDER — BUTORPHANOL TARTRATE 1 MG/ML IJ SOLN
1.0000 mg | INTRAMUSCULAR | Status: DC | PRN
  Administered 2014-04-07: 1 mg via INTRAVENOUS
  Filled 2014-04-07: qty 1

## 2014-04-07 MED ORDER — PHENYLEPHRINE 8 MG IN D5W 100 ML (0.08MG/ML) PREMIX OPTIME
INJECTION | INTRAVENOUS | Status: DC | PRN
  Administered 2014-04-07: 60 ug/min via INTRAVENOUS

## 2014-04-07 MED ORDER — FERROUS SULFATE 325 (65 FE) MG PO TABS
325.0000 mg | ORAL_TABLET | Freq: Two times a day (BID) | ORAL | Status: DC
  Administered 2014-04-08 – 2014-04-10 (×5): 325 mg via ORAL
  Filled 2014-04-07 (×5): qty 1

## 2014-04-07 MED ORDER — BUPIVACAINE IN DEXTROSE 0.75-8.25 % IT SOLN
INTRATHECAL | Status: DC | PRN
  Administered 2014-04-07: 1.4 mL via INTRATHECAL

## 2014-04-07 MED ORDER — ONDANSETRON HCL 4 MG/2ML IJ SOLN: 4.0000 mg | INTRAMUSCULAR | Status: DC | PRN

## 2014-04-07 MED ORDER — PROMETHAZINE HCL 25 MG/ML IJ SOLN: 6.2500 mg | INTRAMUSCULAR | Status: DC | PRN

## 2014-04-07 MED ORDER — ONDANSETRON HCL 4 MG/2ML IJ SOLN
INTRAMUSCULAR | Status: DC | PRN
  Administered 2014-04-07: 4 mg via INTRAVENOUS

## 2014-04-07 MED ORDER — ZOLPIDEM TARTRATE 5 MG PO TABS: 5.0000 mg | ORAL_TABLET | Freq: Every evening | ORAL | Status: DC | PRN

## 2014-04-07 MED ORDER — LANOLIN HYDROUS EX OINT: 1.0000 "application " | TOPICAL_OINTMENT | CUTANEOUS | Status: DC | PRN

## 2014-04-07 MED ORDER — DIPHENHYDRAMINE HCL 50 MG/ML IJ SOLN
12.5000 mg | INTRAMUSCULAR | Status: DC | PRN
Start: 2014-04-07 — End: 2014-04-10

## 2014-04-07 MED ORDER — FAMOTIDINE IN NACL 20-0.9 MG/50ML-% IV SOLN: 20.0000 mg | Freq: Once | INTRAVENOUS | Status: DC

## 2014-04-07 MED ORDER — FAMOTIDINE IN NACL 20-0.9 MG/50ML-% IV SOLN
20.0000 mg | Freq: Once | INTRAVENOUS | Status: AC
  Administered 2014-04-07: 20 mg via INTRAVENOUS
  Filled 2014-04-07: qty 50

## 2014-04-07 MED ORDER — NALBUPHINE HCL 10 MG/ML IJ SOLN: 5.0000 mg | Freq: Once | INTRAMUSCULAR | Status: AC | PRN

## 2014-04-07 MED ORDER — MAGNESIUM HYDROXIDE 400 MG/5ML PO SUSP: 30.0000 mL | ORAL | Status: DC | PRN

## 2014-04-07 MED ORDER — FENTANYL CITRATE 0.05 MG/ML IJ SOLN
INTRAMUSCULAR | Status: AC
  Filled 2014-04-07: qty 2

## 2014-04-07 MED ORDER — NALOXONE HCL 0.4 MG/ML IJ SOLN
0.4000 mg | INTRAMUSCULAR | Status: DC | PRN
Start: 2014-04-07 — End: 2014-04-10

## 2014-04-07 MED ORDER — PHENYLEPHRINE HCL 10 MG/ML IJ SOLN
INTRAMUSCULAR | Status: AC
  Filled 2014-04-07: qty 1

## 2014-04-07 MED ORDER — SIMETHICONE 80 MG PO CHEW: 80.0000 mg | CHEWABLE_TABLET | ORAL | Status: DC | PRN

## 2014-04-07 MED ORDER — SCOPOLAMINE 1 MG/3DAYS TD PT72: 1.0000 | MEDICATED_PATCH | Freq: Once | TRANSDERMAL | Status: DC

## 2014-04-07 MED ORDER — MORPHINE SULFATE (PF) 0.5 MG/ML IJ SOLN
INTRAMUSCULAR | Status: DC | PRN
  Administered 2014-04-07: .1 mg via INTRATHECAL

## 2014-04-07 MED ORDER — PHENYLEPHRINE 8 MG IN D5W 100 ML (0.08MG/ML) PREMIX OPTIME
INJECTION | INTRAVENOUS | Status: AC
  Filled 2014-04-07: qty 100

## 2014-04-07 MED ORDER — SIMETHICONE 80 MG PO CHEW
80.0000 mg | CHEWABLE_TABLET | ORAL | Status: DC
  Administered 2014-04-07 – 2014-04-09 (×3): 80 mg via ORAL
  Filled 2014-04-07 (×3): qty 1

## 2014-04-07 MED ORDER — KETOROLAC TROMETHAMINE 30 MG/ML IJ SOLN: 30.0000 mg | Freq: Four times a day (QID) | INTRAMUSCULAR | Status: DC | PRN

## 2014-04-07 MED ORDER — OXYTOCIN 40 UNITS IN LACTATED RINGERS INFUSION - SIMPLE MED: 62.5000 mL/h | INTRAVENOUS | Status: AC

## 2014-04-07 MED ORDER — METOCLOPRAMIDE HCL 5 MG/ML IJ SOLN
10.0000 mg | Freq: Once | INTRAMUSCULAR | Status: AC
  Administered 2014-04-07: 10 mg via INTRAVENOUS
  Filled 2014-04-07: qty 2

## 2014-04-07 MED ORDER — WITCH HAZEL-GLYCERIN EX PADS: 1.0000 "application " | MEDICATED_PAD | CUTANEOUS | Status: DC | PRN

## 2014-04-07 MED ORDER — LACTATED RINGERS IV SOLN
INTRAVENOUS | Status: DC
  Administered 2014-04-07 (×4): via INTRAVENOUS

## 2014-04-07 MED ORDER — FENTANYL CITRATE 0.05 MG/ML IJ SOLN: 25.0000 ug | INTRAMUSCULAR | Status: DC | PRN

## 2014-04-07 MED ORDER — CITRIC ACID-SODIUM CITRATE 334-500 MG/5ML PO SOLN: 30.0000 mL | Freq: Once | ORAL | Status: DC

## 2014-04-07 MED ORDER — LACTATED RINGERS IV SOLN
INTRAVENOUS | Status: DC | PRN
  Administered 2014-04-07: 17:00:00 via INTRAVENOUS

## 2014-04-07 MED ORDER — ONDANSETRON HCL 4 MG PO TABS: 4.0000 mg | ORAL_TABLET | ORAL | Status: DC | PRN

## 2014-04-07 MED ORDER — KETOROLAC TROMETHAMINE 30 MG/ML IJ SOLN
INTRAMUSCULAR | Status: AC
  Filled 2014-04-07: qty 1

## 2014-04-07 MED ORDER — DIPHENHYDRAMINE HCL 25 MG PO CAPS: 25.0000 mg | ORAL_CAPSULE | ORAL | Status: DC | PRN

## 2014-04-07 MED ORDER — SIMETHICONE 80 MG PO CHEW
80.0000 mg | CHEWABLE_TABLET | Freq: Three times a day (TID) | ORAL | Status: DC
  Administered 2014-04-08 – 2014-04-09 (×6): 80 mg via ORAL
  Filled 2014-04-07 (×6): qty 1

## 2014-04-07 MED ORDER — FENTANYL CITRATE 0.05 MG/ML IJ SOLN
INTRAMUSCULAR | Status: DC | PRN
  Administered 2014-04-07: 85 ug via INTRAVENOUS
  Administered 2014-04-07: 15 ug via INTRATHECAL

## 2014-04-07 SURGICAL SUPPLY — 42 items
BARRIER ADHS 3X4 INTERCEED (GAUZE/BANDAGES/DRESSINGS) ×3 IMPLANT
BENZOIN TINCTURE PRP APPL 2/3 (GAUZE/BANDAGES/DRESSINGS) ×3 IMPLANT
CLAMP CORD UMBIL (MISCELLANEOUS) IMPLANT
CLOTH BEACON ORANGE TIMEOUT ST (SAFETY) ×3 IMPLANT
CONTAINER PREFILL 10% NBF 15ML (MISCELLANEOUS) IMPLANT
DRAPE SHEET LG 3/4 BI-LAMINATE (DRAPES) IMPLANT
DRSG OPSITE POSTOP 4X10 (GAUZE/BANDAGES/DRESSINGS) ×3 IMPLANT
DURAPREP 26ML APPLICATOR (WOUND CARE) ×3 IMPLANT
ELECT REM PT RETURN 9FT ADLT (ELECTROSURGICAL) ×3
ELECTRODE REM PT RTRN 9FT ADLT (ELECTROSURGICAL) ×2 IMPLANT
EXTRACTOR VACUUM M CUP 4 TUBE (SUCTIONS) IMPLANT
GLOVE BIO SURGEON STRL SZ 6.5 (GLOVE) ×3 IMPLANT
GLOVE BIOGEL M 6.5 STRL (GLOVE) ×6 IMPLANT
GLOVE BIOGEL PI IND STRL 6.5 (GLOVE) ×8 IMPLANT
GLOVE BIOGEL PI INDICATOR 6.5 (GLOVE) ×4
GOWN STRL REUS W/TWL LRG LVL3 (GOWN DISPOSABLE) ×9 IMPLANT
KIT ABG SYR 3ML LUER SLIP (SYRINGE) IMPLANT
NEEDLE HYPO 25X5/8 SAFETYGLIDE (NEEDLE) IMPLANT
NS IRRIG 1000ML POUR BTL (IV SOLUTION) ×3 IMPLANT
PACK C SECTION WH (CUSTOM PROCEDURE TRAY) ×3 IMPLANT
PAD ABD 7.5X8 STRL (GAUZE/BANDAGES/DRESSINGS) ×3 IMPLANT
PAD OB MATERNITY 4.3X12.25 (PERSONAL CARE ITEMS) ×3 IMPLANT
RETRACTOR WND ALEXIS 25 LRG (MISCELLANEOUS) ×2 IMPLANT
RTRCTR C-SECT PINK 25CM LRG (MISCELLANEOUS) IMPLANT
RTRCTR C-SECT PINK 34CM XLRG (MISCELLANEOUS) IMPLANT
RTRCTR WOUND ALEXIS 25CM LRG (MISCELLANEOUS) ×3
SPONGE GAUZE 4X4 12PLY STER LF (GAUZE/BANDAGES/DRESSINGS) ×3 IMPLANT
SPONGE LAP 18X18 X RAY DECT (DISPOSABLE) ×6 IMPLANT
STAPLER VISISTAT 35W (STAPLE) IMPLANT
STRIP CLOSURE SKIN 1/2X4 (GAUZE/BANDAGES/DRESSINGS) ×3 IMPLANT
SUT PDS AB 0 CT1 27 (SUTURE) ×6 IMPLANT
SUT PLAIN 0 NONE (SUTURE) ×3 IMPLANT
SUT VIC AB 0 CTX 36 (SUTURE) ×4
SUT VIC AB 0 CTX36XBRD ANBCTRL (SUTURE) ×8 IMPLANT
SUT VIC AB 2-0 CT1 27 (SUTURE) ×2
SUT VIC AB 2-0 CT1 TAPERPNT 27 (SUTURE) ×4 IMPLANT
SUT VIC AB 3-0 SH 27 (SUTURE)
SUT VIC AB 3-0 SH 27X BRD (SUTURE) IMPLANT
SUT VIC AB 4-0 KS 27 (SUTURE) ×3 IMPLANT
TOWEL OR 17X24 6PK STRL BLUE (TOWEL DISPOSABLE) ×3 IMPLANT
TRAY FOLEY CATH 14FR (SET/KITS/TRAYS/PACK) ×3 IMPLANT
WATER STERILE IRR 1000ML POUR (IV SOLUTION) ×3 IMPLANT

## 2014-04-07 NOTE — MAU Note (Signed)
Pt had ? LOF in bed this morning @ 0550, clear fluid, also started having rectal pressure.  Noted light pink discharge with wiping this morning.

## 2014-04-07 NOTE — H&P (Addendum)
Jill Mcdaniel is a 38 y.o. female [phone] at 37 wks and 6 days based on LMP confirmed by 9 wk u/s with EDD 04/22/2014 presents complaining of ROM at 6 am this morning clear fluid.. Contractions every 5-10 minutes. +FM no vaginal bleeding. 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 No active lesions. [facility] Dr. Dion BodyVarnado. She has a h/o cesarean section and desires repeat with tubal ligation. She denies headache/ visual disturbances or ruq pain.   Marland Kitchen. History OB History    Gravida Para Term Preterm AB TAB SAB Ectopic Multiple Living   6 1 1  4 4    1      Past Medical History  Diagnosis Date  . Seropositive for herpes simplex 2 infection   . Blood transfusion without reported diagnosis 1997    anemia  . Anemia   . Vaginal Pap smear, abnormal    Past Surgical History  Procedure Laterality Date  . Fracture surgery    . Abortions    . Cesarean section     D&C x 4 Meds: Valtrex/ monistat/ PNV Allergies: NKDA  Family History: family history is not on file. Social History:  reports that she has quit smoking. Her smoking use included Cigarettes. She smoked 1.00 pack per day. She has never used smokeless tobacco. She reports that she does not drink alcohol or use illicit drugs.   Prenatal Transfer Tool  Maternal Diabetes: No Genetic Screening: Normal Maternal Ultrasounds/Referrals: Normal Fetal Ultrasounds or other Referrals:  None Maternal Substance Abuse:  No Significant Maternal Medications:  Meds include: Other: Valtrex  Significant Maternal Lab Results:  Lab values include: Group B Strep negative Other Comments:  None  Review of Systems  All other systems reviewed and are negative.   Dilation: 2 Effacement (%): 60 Station: -2 Exam by:: Dr. Richardson Doppole Blood pressure 134/80, pulse 103, temperature 98.1 F (36.7 C), temperature source Oral, resp. rate 18, height 5\' 3"  (1.6 m), weight 104.554 kg (230 lb 8 oz), last menstrual period 07/16/2013. Maternal Exam:  Uterine  Assessment: Contraction strength is mild.  Contraction duration is 60 seconds. Contraction frequency is irregular.   Abdomen: Surgical scars: low transverse.   Fetal presentation: vertex  Introitus: Normal vulva. Vulva is negative for lesion.  Normal vagina.  Vagina is negative for ulcerations.  Amniotic fluid character: clear.  Cervix: Cervix evaluated by sterile speculum exam and digital exam.     Fetal Exam Fetal Monitor Review: Mode: fetoscope.   Baseline rate: 150.  Variability: moderate (6-25 bpm).   Pattern: accelerations present and no decelerations.    Fetal State Assessment: Category I - tracings are normal.     Physical Exam  Constitutional: She is oriented to person, place, and time. She appears well-developed and well-nourished.  HENT:  Head: Normocephalic.  Neck: Normal range of motion.  Cardiovascular: Normal rate and regular rhythm.   Respiratory: Effort normal.  GI: She exhibits distension. There is no tenderness.  Genitourinary: Vagina normal and uterus normal. Vulva exhibits no lesion.  Musculoskeletal: She exhibits no edema.  Neurological: She is alert and oriented to person, place, and time.  Skin: Skin is warm and dry.  Psychiatric: She has a normal mood and affect.  Cervix no active lesions ... Cervix 2/50/-2 vertex.    Prenatal labs: ABO, Rh: --/--/A POS (11/13 [id number]) Antibody: NEG (11/13 0958) Rubella:  Immune   RPR:   Nonreactive  HBsAg:   Negative  HIV:   Negative  GBS:  Negative   Assessment/Plan: 37 wks and 6 days with SROM  H/o cesarean section desires Repeat and BTL.  HSV 2 no evidence of active lesions.  Plan for repeat cesarean section with BTL / RBA of cesarean section discussed with patient including but not limited to infection / bleeding damage to bowel bladder baby with the need for further surgery. R/O transfusion HIV/ hep B&C discussed. Pt voiced understanding and desires to proceed.    Jill Mcdaniel J. 04/07/2014, 12:18  PM

## 2014-04-07 NOTE — Op Note (Signed)
Cesarean Section  And Bilateral Tubal ligation Procedure Note  Indications: 37 wks and 6 days with rupture of membranes. h/o cesarean section / pt desries repeat and bilateral tuball ligation   Pre-operative Diagnosis: 37 week 6 day pregnancy.  Post-operative Diagnosis: same  Surgeon: Jessee AversOLE,Okla Qazi J.   Assistants: Dr. Myna HidalgoJennifer Ozan Assisted due to the complexity of the case and concern for adhesive disease   Anesthesia: Spinal anesthesia  ASA Class: 2   Procedure Details   The patient was seen in the Holding Room. The risks, benefits, complications, treatment options, and expected outcomes were discussed with the patient.  The patient concurred with the proposed plan, giving informed consent.  The site of surgery properly noted/marked. The patient was taken to Operating Room # 1, identified as Jill Mcdaniel and the procedure verified as C-Section Delivery. A Time Out was held and the above information confirmed.  After induction of anesthesia, the patient was draped and prepped in the usual sterile manner. A Pfannenstiel incision was made and carried down through the subcutaneous tissue to the fascia. Fascial incision was made and extended transversely. The fascia was separated from the underlying rectus tissue superiorly and inferiorly. The peritoneum was identified and entered. Peritoneal incision was extended longitudinally. The utero-vesical peritoneal reflection was incised transversely and the bladder flap was bluntly freed from the lower uterine segment. A low transverse uterine incision was made. Delivered from cephalic presentation was a  Female with Apgar scores of 8 at one minute and 9 at five minutes. After the umbilical cord was clamped and cut cord blood was obtained for evaluation. The placenta was removed intact and appeared normal. The uterine outline, tubes and ovaries appeared normal. The uterine incision was closed with running locked sutures of 0 vicryl .Marland Kitchen. a second layer of  suture was used to imbricate the incision. . Attention was turned to the right fallopian tube which was grasped with a babcock clamp. A window was made into the mesosalpinx. The tube was ligated with a free tie of 0 plain gut medially and laterally. The intervening segment was excised and sent to pathology. This was repeat on the left fallopian tube.  Hemostasis was observed. Lavage was carried out until clear.  Interseed was placed over the uterine incision. The fascia was then reapproximated with running sutures of 0 pds . The skin was reapproximated with 4-0 vicryl .  Instrument, sponge, and needle counts were correct prior the abdominal closure and at the conclusion of the case.   Findings: Adhesions of the bladder to the lower uterine segment.. Normal fallopian tubes and ovaries.. Female infant in the cephalic presentation.   Estimated Blood Loss:  800 mL         Drains: Foley catheter          Total IV Fluids:  Per anesthesia ml         Specimens: Placenta and Disposition:  Sent to Pathology          Implants: none         Complications:  None; patient tolerated the procedure well.         Disposition: PACU - hemodynamically stable.         Condition: stable  Attending Attestation: I performed the procedure.

## 2014-04-07 NOTE — Anesthesia Procedure Notes (Signed)
Spinal Patient location during procedure: OR Start time: 04/07/2014 4:46 PM Staffing Anesthesiologist: CASSIDY, AMY Performed by: anesthesiologist  Preanesthetic Checklist Completed: patient identified, site marked, surgical consent, pre-op evaluation, timeout performed, IV checked, risks and benefits discussed and monitors and equipment checked Spinal Block Patient position: sitting Prep: site prepped and draped and DuraPrep Patient monitoring: heart rate, cardiac monitor, continuous pulse ox and blood pressure Approach: midline Location: L3-4 Injection technique: single-shot Needle Needle type: Pencan  Needle gauge: 24 G Needle length: 10 cm Assessment Sensory level: T4 Additional Notes Clear free flow CSF on first attempt.  No paresthesia.  Patient tolerated procedure well with no apparent complications.  Jasmine DecemberA. Cassidy, MD

## 2014-04-07 NOTE — Consult Note (Signed)
Neonatology Note:   Attendance at C-section:    I was asked by Dr. Richardson Doppole to attend this repeat C/S at 37 6/7 weeks following SROM this morning. The mother is a [id number] A pos, GBS neg with a history of HSV, but no lesions currently and on Valtrex. ROM 12 hours prior to delivery, fluid clear. Infant vigorous with good spontaneous cry and tone. Needed only minimal bulb suctioning. Ap 8/9. Lungs clear to ausc in DR. To CN to care of Pediatrician.   Doretha Souhristie C. Mykiah Schmuck, MD

## 2014-04-07 NOTE — MAU Note (Signed)
Urine in lab 

## 2014-04-07 NOTE — Anesthesia Postprocedure Evaluation (Signed)
  Anesthesia Post-op Note  Anesthesia Post Note  Patient: Jill Mcdaniel  Procedure(s) Performed: Procedure(s) (LRB): CESAREAN SECTION WITH BILATERAL TUBAL LIGATION (Bilateral)  Anesthesia type: Spinal  Patient location: PACU  Post pain: Pain level controlled  Post assessment: Post-op Vital signs reviewed  Last Vitals:  Filed Vitals:   04/07/14 1900  BP: 119/54  Pulse: 77  Temp:   Resp: 8    Post vital signs: Reviewed  Level of consciousness: awake  Complications: No apparent anesthesia complications

## 2014-04-07 NOTE — Progress Notes (Signed)
Informed pt I can call MD to obtain order for pain med, pt declines @ this time, wants to wait.

## 2014-04-07 NOTE — Anesthesia Preprocedure Evaluation (Addendum)
Anesthesia Evaluation  Patient identified by MRN, date of birth, ID band Patient awake    Reviewed: Allergy & Precautions, H&P , NPO status , Patient's Chart, lab work & pertinent test results, reviewed documented beta blocker date and time   History of Anesthesia Complications Negative for: history of anesthetic complications  Airway Mallampati: III  TM Distance: >3 FB Neck ROM: full    Dental  (+) Teeth Intact   Pulmonary neg pulmonary ROS, former smoker,  breath sounds clear to auscultation        Cardiovascular hypertension (GHTN), Rhythm:regular Rate:Normal     Neuro/Psych negative neurological ROS  negative psych ROS   GI/Hepatic negative GI ROS, Neg liver ROS,   Endo/Other  Morbid obesity  Renal/GU negative Renal ROS  negative genitourinary   Musculoskeletal   Abdominal   Peds  Hematology  (+) anemia , H/o blood transfusion 1997 (bleeding after TAB)   Anesthesia Other Findings Ate full meal including sausage biscuit at 0700 Have ordered pepcid, reglan and bicitra for pre-op  Reproductive/Obstetrics (+) Pregnancy (h/o C/S x1, SROM, for repeat C/S)                           Anesthesia Physical Anesthesia Plan  ASA: III  Anesthesia Plan: Spinal   Post-op Pain Management:    Induction:   Airway Management Planned:   Additional Equipment:   Intra-op Plan:   Post-operative Plan:   Informed Consent: I have reviewed the patients History and Physical, chart, labs and discussed the procedure including the risks, benefits and alternatives for the proposed anesthesia with the patient or authorized representative who has indicated his/her understanding and acceptance.     Plan Discussed with: Surgeon and CRNA  Anesthesia Plan Comments:        Anesthesia Quick Evaluation

## 2014-04-07 NOTE — Transfer of Care (Signed)
Immediate Anesthesia Transfer of Care Note  Patient: Jill Mcdaniel  Procedure(s) Performed: Procedure(s): CESAREAN SECTION WITH BILATERAL TUBAL LIGATION (Bilateral)  Patient Location: PACU  Anesthesia Type:Spinal  Level of Consciousness: awake, alert  and oriented  Airway & Oxygen Therapy: Patient Spontanous Breathing  Post-op Assessment: Report given to PACU RN and Post -op Vital signs reviewed and stable  Post vital signs: Reviewed and stable  Complications: No apparent anesthesia complications

## 2014-04-08 LAB — CBC
HCT: 33.8 % — ABNORMAL LOW (ref 36.0–46.0)
HEMOGLOBIN: 11.5 g/dL — AB (ref 12.0–15.0)
MCH: 31.8 pg (ref 26.0–34.0)
MCHC: 34 g/dL (ref 30.0–36.0)
MCV: 93.4 fL (ref 78.0–100.0)
Platelets: 216 10*3/uL (ref 150–400)
RBC: 3.62 MIL/uL — AB (ref 3.87–5.11)
RDW: 13.9 % (ref 11.5–15.5)
WBC: 11.2 10*3/uL — ABNORMAL HIGH (ref 4.0–10.5)

## 2014-04-08 NOTE — Plan of Care (Signed)
Problem: Phase I Progression Outcomes Goal: VS, stable, temp < 100.4 degrees F Outcome: Completed/Met Date Met:  04/08/14 Goal: Initial discharge plan identified Outcome: Completed/Met Date Met:  04/08/14 Goal: Other Phase I Outcomes/Goals Outcome: Not Applicable Date Met:  35/00/93  Problem: Phase II Progression Outcomes Goal: Pain controlled on oral analgesia Outcome: Completed/Met Date Met:  04/08/14 Goal: Progress activity as tolerated unless otherwise ordered Outcome: Completed/Met Date Met:  04/08/14

## 2014-04-08 NOTE — Plan of Care (Signed)
Problem: Phase I Progression Outcomes Goal: Voiding adequately Outcome: Completed/Met Date Met:  04/08/14  Problem: Phase II Progression Outcomes Goal: Afebrile, VS remain stable Outcome: Completed/Met Date Met:  04/08/14 Goal: Other Phase II Outcomes/Goals Outcome: Completed/Met Date Met:  04/08/14  Problem: Discharge Progression Outcomes Goal: Tolerating diet Outcome: Completed/Met Date Met:  04/08/14

## 2014-04-08 NOTE — Lactation Note (Signed)
This note was copied from the chart of Jill Sylvie Farrierakisha Sahakian. Lactation Consultation Note  Mother states she only wants to formula feed.  Patient Name: Jill Mcdaniel [id number]'s Date: 04/08/2014     Maternal Data    Feeding    LATCH Score/Interventions                      Lactation Tools Discussed/Used     Consult Status      [name], [name] 04/08/2014, 1:47 PM

## 2014-04-08 NOTE — Plan of Care (Signed)
Problem: Phase I Progression Outcomes Goal: Pain controlled with appropriate interventions Outcome: Completed/Met Date Met:  04/08/14 Goal: Foley catheter patent Outcome: Completed/Met Date Met:  04/08/14 Goal: OOB as tolerated unless otherwise ordered Outcome: Completed/Met Date Met:  04/08/14 Goal: IS, TCDB as ordered Outcome: Completed/Met Date Met:  04/08/14  Problem: Phase II Progression Outcomes Goal: Tolerating diet Outcome: Completed/Met Date Met:  04/08/14

## 2014-04-08 NOTE — Progress Notes (Signed)
Subjective: Postpartum Day 1: Cesarean Delivery Patient reports incisional pain and tolerating PO.  Foley still in place   Objective: Vital signs in last 24 hours: Temp:  [97.6 F (36.4 C)-99.4 F (37.4 C)] 99.2 F (37.3 C) (11/14 0508) Pulse Rate:  [70-122] 98 (11/14 0508) Resp:  [8-29] 18 (11/14 0508) BP: (119-148)/(54-104) 133/68 mmHg (11/14 0508) SpO2:  [96 %-100 %] 96 % (11/14 0508)  Physical Exam:  General: alert Lochia: appropriate Uterine Fundus: firm Incision: bandage clean dry  And intact  DVT Evaluation: No evidence of DVT seen on physical exam.   Recent Labs  04/07/14 0824 04/08/14 0617  HGB 12.7 11.5*  HCT 37.0 33.8*    Assessment/Plan: Status post Cesarean section. Doing well postoperatively.  Continue current care.  Lory Nowaczyk J. 04/08/2014, 9:14 AM

## 2014-04-09 NOTE — Progress Notes (Signed)
Subjective: Postpartum Day 2: Cesarean Delivery Patient reports incisional pain, tolerating PO, + flatus and no problems voiding.    Objective: Vital signs in last 24 hours: Temp:  [97.6 F (36.4 C)-98.1 F (36.7 C)] 98 F (36.7 C) (11/15 0553) Pulse Rate:  [85-96] 85 (11/15 0553) Resp:  [18-20] 20 (11/15 0553) BP: (123-141)/(66-85) 123/66 mmHg (11/15 0553) SpO2:  [99 %-100 %] 99 % (11/14 1710)  Physical Exam:  General: alert and cooperative Lochia: appropriate Uterine Fundus: firm Incision: healing well DVT Evaluation: No evidence of DVT seen on physical exam.   Recent Labs  04/07/14 0824 04/08/14 0617  HGB 12.7 11.5*  HCT 37.0 33.8*    Assessment/Plan: Status post Cesarean section. Doing well postoperatively.  Continue current care  Probable Discharge home in AM .  Jill Mcdaniel J. 04/09/2014, 10:35 AM
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
## 2014-04-10 ENCOUNTER — Encounter (HOSPITAL_COMMUNITY): Payer: Self-pay | Admitting: Obstetrics and Gynecology

## 2014-04-10 MED ORDER — IBUPROFEN 600 MG PO TABS: 600.0000 mg | ORAL_TABLET | Freq: Four times a day (QID) | ORAL | Status: DC

## 2014-04-10 MED ORDER — FLUCONAZOLE 200 MG PO TABS: 200.0000 mg | ORAL_TABLET | Freq: Once | ORAL | Status: AC

## 2014-04-10 MED ORDER — OXYCODONE-ACETAMINOPHEN 5-325 MG PO TABS: ORAL_TABLET | ORAL | Status: DC

## 2014-04-10 NOTE — Progress Notes (Signed)
Ur chart review completed.  

## 2014-04-10 NOTE — Plan of Care (Signed)
Problem: Phase II Progression Outcomes Goal: Incision intact & without signs/symptoms of infection Outcome: Completed/Met Date Met:  04/10/14     

## 2014-04-10 NOTE — Discharge Summary (Signed)
Obstetric Discharge Summary Reason for Admission: rupture of membranes Prenatal Procedures: NST and ultrasound Intrapartum Procedures: cesarean: low cervical, transverse Repeat, BTL Postpartum Procedures: none Complications-Operative and Postpartum: none HEMOGLOBIN  Date Value Ref Range Status  04/08/2014 11.5* 12.0 - 15.0 g/dL Final   HCT  Date Value Ref Range Status  04/08/2014 33.8* 36.0 - 46.0 % Final    Physical Exam:  General: alert, cooperative and no distress Lochia: appropriate Uterine Fundus: firm Incision: healing well DVT Evaluation: No evidence of DVT seen on physical exam. Calf/Ankle edema is present.  Discharge Diagnoses: Term Pregnancy-delivered  Discharge Information: Date: 04/10/2014 Activity: pelvic rest Diet: routine Medications: None, Ibuprofen and Percocet Condition: improved Instructions: See discharge instructions. Discharge to: home Follow-up Information    Follow up with Geryl RankinsVARNADO, Tyre Beaver, MD. Schedule an appointment as soon as possible for a visit in 2 weeks.   Specialty:  Obstetrics and Gynecology   Why:  Post op incision check, Baby blues check   Contact information:   [address] [place] [address] [phone]       Newborn Data: Live born female  Birth Weight: 6 lb 14.4 oz (3130 g) APGAR: 8, 9  Home with mother.  Geryl RankinsVARNADO, Deepak Bless 04/10/2014, 8:29 AM

## 2014-04-10 NOTE — Discharge Instructions (Signed)
Cesarean Delivery, Care After °Refer to this sheet in the next few weeks. These instructions provide you with information on caring for yourself after your procedure. Your health care provider may also give you specific instructions. Your treatment has been planned according to current medical practices, but problems sometimes occur. Call your health care provider if you have any problems or questions after you go home. °HOME CARE INSTRUCTIONS  °· Only take over-the-counter or prescription medications as directed by your health care provider. °· Do not drink alcohol, especially if you are breastfeeding or taking medication to relieve pain. °· Do not chew or smoke tobacco. °· Continue to use good perineal care. Good perineal care includes: °¨ Wiping your perineum from front to back. °¨ Keeping your perineum clean. °· Check your surgical cut (incision) daily for increased redness, drainage, swelling, or separation of skin. °· Clean your incision gently with soap and water every day, and then pat it dry. If your health care provider says it is okay, leave the incision uncovered. Use a bandage (dressing) if the incision is draining fluid or appears irritated. If the adhesive strips across the incision do not fall off within 7 days, carefully peel them off. °· Hug a pillow when coughing or sneezing until your incision is healed. This helps to relieve pain. °· Do not use tampons or douche until your health care provider says it is okay. °· Shower, wash your hair, and take tub baths as directed by your health care provider. °· Wear a well-fitting bra that provides breast support. °· Limit wearing support panties or control-top hose. °· Drink enough fluids to keep your urine clear or pale yellow. °· Eat high-fiber foods such as whole grain cereals and breads, brown rice, beans, and fresh fruits and vegetables every day. These foods may help prevent or relieve constipation. °· Resume activities such as climbing stairs,  driving, lifting, exercising, or traveling as directed by your health care provider. °· Talk to your health care provider about resuming sexual activities. This is dependent upon your risk of infection, your rate of healing, and your comfort and desire to resume sexual activity. °· Try to have someone help you with your household activities and your newborn for at least a few days after you leave the hospital. °· Rest as much as possible. Try to rest or take a nap when your newborn is sleeping. °· Increase your activities gradually. °· Keep all of your scheduled postpartum appointments. It is very important to keep your scheduled follow-up appointments. At these appointments, your health care provider will be checking to make sure that you are healing physically and emotionally. °SEEK MEDICAL CARE IF:  °· You are passing large clots from your vagina. Save any clots to show your health care provider. °· You have a foul smelling discharge from your vagina. °· You have trouble urinating. °· You are urinating frequently. °· You have pain when you urinate. °· You have a change in your bowel movements. °· You have increasing redness, pain, or swelling near your incision. °· You have pus draining from your incision. °· Your incision is separating. °· You have painful, hard, or reddened breasts. °· You have a severe headache. °· You have blurred vision or see spots. °· You feel sad or depressed. °· You have thoughts of hurting yourself or your newborn. °· You have questions about your care, the care of your newborn, or medications. °· You are dizzy or light-headed. °· You have a rash. °· You   have pain, redness, or swelling at the site of the removed intravenous access (IV) tube. °· You have nausea or vomiting. °· You stopped breastfeeding and have not had a menstrual period within 12 weeks of stopping. °· You are not breastfeeding and have not had a menstrual period within 12 weeks of delivery. °· You have a fever. °SEEK  IMMEDIATE MEDICAL CARE IF: °· You have persistent pain. °· You have chest pain. °· You have shortness of breath. °· You faint. °· You have leg pain. °· You have stomach pain. °· Your vaginal bleeding saturates 2 or more sanitary pads in 1 hour. °MAKE SURE YOU:  °· Understand these instructions. °· Will watch your condition. °· Will get help right away if you are not doing well or get worse. °Document Released: 02/01/2002 Document Revised: 09/26/2013 Document Reviewed: 01/07/2012 °ExitCare® Patient Information ©2015 [employer]. This information is not intended to replace advice given to you by your health care provider. Make sure you discuss any questions you have with your health care provider. °Postpartum Depression and Baby Blues °The postpartum period begins right after the birth of a baby. During this time, there is often a great amount of joy and excitement. It is also a time of many changes in the life of the parents. Regardless of how many times a mother gives birth, each child brings new challenges and dynamics to the family. It is not unusual to have feelings of excitement along with confusing shifts in moods, emotions, and thoughts. All mothers are at risk of developing postpartum depression or the "baby blues." These mood changes can occur right after giving birth, or they may occur many months after giving birth. The baby blues or postpartum depression can be mild or severe. Additionally, postpartum depression can go away rather quickly, or it can be a long-term condition.  °CAUSES °Raised hormone levels and the rapid drop in those levels are thought to be a main cause of postpartum depression and the baby blues. A number of hormones change during and after pregnancy. Estrogen and progesterone usually decrease right after the delivery of your baby. The levels of thyroid hormone and various cortisol steroids also rapidly drop. Other factors that play a role in these mood changes include major life  events and genetics.  °RISK FACTORS °If you have any of the following risks for the baby blues or postpartum depression, know what symptoms to watch out for during the postpartum period. Risk factors that may increase the likelihood of getting the baby blues or postpartum depression include: °· Having a personal or family history of depression.   °· Having depression while being pregnant.   °· Having premenstrual mood issues or mood issues related to oral contraceptives. °· Having a lot of life stress.   °· Having marital conflict.   °· Lacking a social support network.   °· Having a baby with special needs.   °· Having health problems, such as diabetes.   °SIGNS AND SYMPTOMS °Symptoms of baby blues include: °· Brief changes in mood, such as going from extreme happiness to sadness. °· Decreased concentration.   °· Difficulty sleeping.   °· Crying spells, tearfulness.   °· Irritability.   °· Anxiety.   °Symptoms of postpartum depression typically begin within the first month after giving birth. These symptoms include: °· Difficulty sleeping or excessive sleepiness.   °· Marked weight loss.   °· Agitation.   °· Feelings of worthlessness.   °· Lack of interest in activity or food.   °Postpartum psychosis is a very serious condition and can be dangerous. Fortunately, it   is rare. Displaying any of the following symptoms is cause for immediate medical attention. Symptoms of postpartum psychosis include:  °· Hallucinations and delusions.   °· Bizarre or disorganized behavior.   °· Confusion or disorientation.   °DIAGNOSIS  °A diagnosis is made by an evaluation of your symptoms. There are no medical or lab tests that lead to a diagnosis, but there are various questionnaires that a health care provider may use to identify those with the baby blues, postpartum depression, or psychosis. Often, a screening tool called the Edinburgh Postnatal Depression Scale is used to diagnose depression in the postpartum period.   °TREATMENT °The baby blues usually goes away on its own in 1-2 weeks. Social support is often all that is needed. You will be encouraged to get adequate sleep and rest. Occasionally, you may be given medicines to help you sleep.  °Postpartum depression requires treatment because it can last several months or longer if it is not treated. Treatment may include individual or group therapy, medicine, or both to address any social, physiological, and psychological factors that may play a role in the depression. Regular exercise, a healthy diet, rest, and social support may also be strongly recommended.  °Postpartum psychosis is more serious and needs treatment right away. Hospitalization is often needed. °HOME CARE INSTRUCTIONS °· Get as much rest as you can. Nap when the baby sleeps.   °· Exercise regularly. Some women find yoga and walking to be beneficial.   °· Eat a balanced and nourishing diet.   °· Do little things that you enjoy. Have a cup of tea, take a bubble bath, read your favorite magazine, or listen to your favorite music. °· Avoid alcohol.   °· Ask for help with household chores, cooking, grocery shopping, or running errands as needed. Do not try to do everything.   °· Talk to people close to you about how you are feeling. Get support from your partner, family members, friends, or other new moms. °· Try to stay positive in how you think. Think about the things you are grateful for.   °· Do not spend a lot of time alone.   °· Only take over-the-counter or prescription medicine as directed by your health care provider. °· Keep all your postpartum appointments.   °· Let your health care provider know if you have any concerns.   °SEEK MEDICAL CARE IF: °You are having a reaction to or problems with your medicine. °SEEK IMMEDIATE MEDICAL CARE IF: °· You have suicidal feelings.   °· You think you may harm the baby or someone else. °MAKE SURE YOU: °· Understand these instructions. °· Will watch your  condition. °· Will get help right away if you are not doing well or get worse. °Document Released: 02/14/2004 Document Revised: 05/17/2013 Document Reviewed: 02/21/2013 °ExitCare® Patient Information ©2015 [employer]. This information is not intended to replace advice given to you by your health care provider. Make sure you discuss any questions you have with your health care provider. ° °

## 2014-04-17 ENCOUNTER — Encounter (HOSPITAL_COMMUNITY): Admission: RE | Payer: Self-pay | Source: Ambulatory Visit

## 2014-04-17 ENCOUNTER — Inpatient Hospital Stay (HOSPITAL_COMMUNITY)
Admission: RE | Admit: 2014-04-17 | Payer: Medicaid Other | Source: Ambulatory Visit | Admitting: Obstetrics and Gynecology

## 2014-04-17 SURGERY — Surgical Case
Anesthesia: Regional | Laterality: Bilateral

## 2015-06-19 ENCOUNTER — Other Ambulatory Visit (HOSPITAL_COMMUNITY)
Admission: RE | Admit: 2015-06-19 | Discharge: 2015-06-19 | Disposition: A | Discharge status: Home / Self Care | Payer: 59 | Source: Ambulatory Visit | Attending: Nurse Practitioner | Admitting: Nurse Practitioner

## 2015-06-19 ENCOUNTER — Other Ambulatory Visit: Payer: Self-pay | Admitting: Nurse Practitioner

## 2015-06-19 DIAGNOSIS — Z1151 Encounter for screening for human papillomavirus (HPV): Secondary | ICD-10-CM | POA: Diagnosis not present

## 2015-06-19 DIAGNOSIS — Z01419 Encounter for gynecological examination (general) (routine) without abnormal findings: Secondary | ICD-10-CM | POA: Diagnosis present

## 2015-06-22 LAB — CYTOLOGY - PAP

## 2015-07-12 ENCOUNTER — Other Ambulatory Visit: Payer: Self-pay | Admitting: Nurse Practitioner

## 2016-09-28 ENCOUNTER — Encounter (HOSPITAL_COMMUNITY): Payer: Self-pay | Admitting: Emergency Medicine

## 2016-09-28 ENCOUNTER — Ambulatory Visit (HOSPITAL_COMMUNITY)
Admission: EM | Admit: 2016-09-28 | Discharge: 2016-09-28 | Disposition: A | Discharge status: Home / Self Care | Payer: 59 | Attending: Internal Medicine | Admitting: Internal Medicine

## 2016-09-28 DIAGNOSIS — H578 Other specified disorders of eye and adnexa: Secondary | ICD-10-CM | POA: Diagnosis not present

## 2016-09-28 DIAGNOSIS — H109 Unspecified conjunctivitis: Secondary | ICD-10-CM | POA: Diagnosis not present

## 2016-09-28 DIAGNOSIS — H5789 Other specified disorders of eye and adnexa: Secondary | ICD-10-CM

## 2016-09-28 MED ORDER — EYE WASH OPHTH SOLN
OPHTHALMIC | Status: AC
  Filled 2016-09-28: qty 118

## 2016-09-28 MED ORDER — POLYMYXIN B-TRIMETHOPRIM 10000-0.1 UNIT/ML-% OP SOLN: 1.0000 [drp] | OPHTHALMIC | 0 refills | Status: DC

## 2016-09-28 NOTE — ED Provider Notes (Signed)
CSN: [id number]     Arrival date & time 09/28/16  1940 History   First MD Initiated Contact with Patient 09/28/16 2153     Chief Complaint  Patient presents with  . Eye Pain   (Consider location/radiation/quality/duration/timing/severity/associated sxs/prior Treatment) 41 year old female complaining of foreign body sensation of the right. She states that her eye feels irritated itchy and is red. This started around 10:30 this morning. No known trauma.      Past Medical History:  Diagnosis Date  . Anemia   . Blood transfusion without reported diagnosis 1997   anemia  . Seropositive for herpes simplex 2 infection   . Vaginal Pap smear, abnormal    Past Surgical History:  Procedure Laterality Date  . Abortions    . CESAREAN SECTION    . CESAREAN SECTION WITH BILATERAL TUBAL LIGATION Bilateral 04/07/2014   Procedure: CESAREAN SECTION WITH BILATERAL TUBAL LIGATION;  Surgeon: Dorien Chihuahuaara J. Richardson Doppole, MD;  Location: [facility];  Service: Obstetrics;  Laterality: Bilateral;  . FRACTURE SURGERY     History reviewed. No pertinent family history. Social History  Substance Use Topics  . Smoking status: Former Smoker    Packs/day: 1.00    Types: Cigarettes  . Smokeless tobacco: Never Used  . Alcohol use No     Comment: socially   OB History    Gravida Para Term Preterm AB Living   6 2 2   4 2    SAB TAB Ectopic Multiple Live Births     4   0 2     Review of Systems  Constitutional: Negative.   HENT: Negative for ear pain and facial swelling.   Eyes: Positive for discharge, redness and itching. Negative for visual disturbance.  All other systems reviewed and are negative.   Allergies  Patient has no known allergies.  Home Medications   Prior to Admission medications   Medication Sig Start Date End Date Taking? Authorizing Provider  trimethoprim-polymyxin b (POLYTRIM) ophthalmic solution Place 1 drop into the right eye every 4 (four) hours. 09/28/16   Hayden RasmussenMabe, Zoe Goonan, NP   Meds Ordered and  Administered this Visit  Medications - No data to display  BP (!) 171/101 (BP Location: Right Arm)   Pulse 75   Temp 98.4 F (36.9 C) (Oral)   Resp 18   SpO2 100%  No data found.   Physical Exam  Constitutional: She appears well-developed and well-nourished. No distress.  Eyes: EOM are normal. Pupils are equal, round, and reactive to light.  Lower conjunctival erythema and mild swelling. Clear watery drainage. Anterior chamber is clear. Anesthetic drops placed and the eye followed by forcing. No uptake. No foreign body seen no defect seen. The eye was then irrigated with eyewash.  Neck: Neck supple.  Cardiovascular: Normal rate.   Pulmonary/Chest: Effort normal.  Neurological: She is alert.  Skin: Skin is warm.  Psychiatric: She has a normal mood and affect.  Nursing note and vitals reviewed.   Urgent Care Course     Procedures (including critical care time)  Labs Review Labs Reviewed - No data to display  Imaging Review No results found.   Visual Acuity Review  Right Eye Distance:   Left Eye Distance:   Bilateral Distance:    Right Eye Near:   Left Eye Near:    Bilateral Near:         MDM   1. Eye irritation   2. Conjunctivitis of right eye, unspecified conjunctivitis type    Use the  antibiotic drops as directed. Also obtain a bottle of Zaditor eyedrops and use one drop in the right eye twice a day. Place warm moist compresses over the eye to 3 times a day and in the morning if you develop drainage or clumps of discharge around the eye. Meds ordered this encounter  Medications  . trimethoprim-polymyxin b (POLYTRIM) ophthalmic solution    Sig: Place 1 drop into the right eye every 4 (four) hours.    Dispense:  10 mL    Refill:  0    Order Specific Question:   Supervising Provider    Answer:   Eustace Moore [[id number]]       Hayden Rasmussen, NP 09/28/16 2214

## 2016-09-28 NOTE — ED Triage Notes (Signed)
[facility] with a complaint of right eye pain and drainage that started earlier this am.

## 2016-09-28 NOTE — Discharge Instructions (Signed)
Use the antibiotic drops as directed. Also obtain a bottle of Zaditor eyedrops and use one drop in the right eye twice a day. Place warm moist compresses over the eye to 3 times a day and in the morning if you develop drainage or clumps of discharge around the eye.

## 2017-06-20 ENCOUNTER — Encounter (HOSPITAL_COMMUNITY): Payer: Self-pay | Admitting: Emergency Medicine

## 2017-06-20 ENCOUNTER — Emergency Department (HOSPITAL_COMMUNITY): Payer: 59

## 2017-06-20 ENCOUNTER — Emergency Department (HOSPITAL_COMMUNITY)
Admission: EM | Admit: 2017-06-20 | Discharge: 2017-06-20 | Disposition: A | Discharge status: Home / Self Care | Payer: 59 | Attending: Emergency Medicine | Admitting: Emergency Medicine

## 2017-06-20 DIAGNOSIS — J069 Acute upper respiratory infection, unspecified: Secondary | ICD-10-CM | POA: Insufficient documentation

## 2017-06-20 DIAGNOSIS — Z87891 Personal history of nicotine dependence: Secondary | ICD-10-CM | POA: Insufficient documentation

## 2017-06-20 LAB — CBC
HEMATOCRIT: 36.6 % (ref 36.0–46.0)
Hemoglobin: 11.8 g/dL — ABNORMAL LOW (ref 12.0–15.0)
MCH: 26.5 pg (ref 26.0–34.0)
MCHC: 32.2 g/dL (ref 30.0–36.0)
MCV: 82.2 fL (ref 78.0–100.0)
Platelets: 313 10*3/uL (ref 150–400)
RBC: 4.45 MIL/uL (ref 3.87–5.11)
RDW: 15.6 % — AB (ref 11.5–15.5)
WBC: 7.6 10*3/uL (ref 4.0–10.5)

## 2017-06-20 LAB — BASIC METABOLIC PANEL
ANION GAP: 9 (ref 5–15)
BUN: 9 mg/dL (ref 6–20)
CHLORIDE: 104 mmol/L (ref 101–111)
CO2: 24 mmol/L (ref 22–32)
Calcium: 8.8 mg/dL — ABNORMAL LOW (ref 8.9–10.3)
Creatinine, Ser: 0.85 mg/dL (ref 0.44–1.00)
GFR calc Af Amer: >60 mL/min (ref >60)
Glucose, Bld: 90 mg/dL (ref 65–99)
Potassium: 3.8 mmol/L (ref 3.5–5.1)
Sodium: 137 mmol/L (ref 135–145)

## 2017-06-20 LAB — I-STAT BETA HCG BLOOD, ED (MC, WL, AP ONLY): I-stat hCG, quantitative: <5 m[IU]/mL (ref <5)

## 2017-06-20 LAB — I-STAT TROPONIN, ED: Troponin i, poc: 0 ng/mL (ref 0.00–0.08)

## 2017-06-20 MED ORDER — PREDNISONE 20 MG PO TABS: 40.0000 mg | ORAL_TABLET | Freq: Every day | ORAL | 0 refills | Status: AC

## 2017-06-20 NOTE — ED Notes (Signed)
Called pt second time for room, no answer.

## 2017-06-20 NOTE — Discharge Instructions (Addendum)
You were seen in the emergency department for shortness of breath. Your blood work and Xray were normal. Your symptoms are most likely caused by a viral illness. Please continue taking theraflu and mucinex needed for your symptoms. Your cough may persist for 4-6 weeks.  You were also sent with a short course of steroids.  Reasons to return to care would be if you have shortness of breath that is not resolving, or if you are unable to stay hydrated by mouth.  Please schedule follow up to be seen by your regular doctor within the next 5-7 days.

## 2017-06-20 NOTE — ED Triage Notes (Signed)
Pt presents to ED for assessment of productive cough (yellow sputum), congestion, chest pressure, SOB at rest.  Pt breathing between sentences in triage.

## 2017-06-20 NOTE — ED Provider Notes (Signed)
[facility] Provider Note   CSN: [id number] Arrival date & time: 06/20/17  1257     History   Chief Complaint Chief Complaint  Patient presents with  . Shortness of Breath    HPI Jill Mcdaniel is a 42 y.o. female with no significant PMH presenting for dyspnea and postnasal drip of 4 days duration. Patient reports she noticed feeling of swollen throat 4 days ago. No fevers, no rhinorrhea or congestion, no ear pain. She has not had chest pain. No N/V/D/C.  She reports taking theraflu at home and also has taken 30 mg of prednisone x3 days leftover from a family member.  Past Medical History:  Diagnosis Date  . Anemia   . Blood transfusion without reported diagnosis 1997   anemia  . Seropositive for herpes simplex 2 infection   . Vaginal Pap smear, abnormal     Patient Active Problem List   Diagnosis Date Noted  . S/P cesarean section 04/07/2014    Past Surgical History:  Procedure Laterality Date  . Abortions    . CESAREAN SECTION    . CESAREAN SECTION WITH BILATERAL TUBAL LIGATION Bilateral 04/07/2014   Procedure: CESAREAN SECTION WITH BILATERAL TUBAL LIGATION;  Surgeon: Dorien Chihuahuaara J. Richardson Doppole, MD;  Location: [facility];  Service: Obstetrics;  Laterality: Bilateral;  . FRACTURE SURGERY      OB History    Gravida Para Term Preterm AB Living   6 2 2   4 2    SAB TAB Ectopic Multiple Live Births     4   0 2       Home Medications    Prior to Admission medications   Medication Sig Start Date End Date Taking? Authorizing Provider  predniSONE (DELTASONE) 20 MG tablet Take 2 tablets (40 mg total) by mouth daily for 4 days. 06/20/17 06/24/17  Howard PouchFeng, Mohammed Mcandrew, MD  trimethoprim-polymyxin b (POLYTRIM) ophthalmic solution Place 1 drop into the right eye every 4 (four) hours. 09/28/16   Hayden RasmussenMabe, David, NP    Family History History reviewed. No pertinent family history.  Social History Social History   Tobacco Use  . Smoking status: Former Smoker   Packs/day: 1.00    Types: Cigarettes  . Smokeless tobacco: Never Used  Substance Use Topics  . Alcohol use: No    Comment: socially  . Drug use: No     Allergies   Patient has no known allergies.   Review of Systems Review of Systems See HPI for ROS   Physical Exam Updated Vital Signs BP (!) 140/99   Pulse 74   Temp 99.4 F (37.4 C) (Oral)   Resp 18   Ht 5\' 3"  (1.6 m)   Wt 95.3 kg (210 lb)   SpO2 100%   BMI 37.20 kg/m   Physical Exam  Constitutional: She appears well-developed and well-nourished.  HENT:  Head: Normocephalic and atraumatic.  Mouth/Throat: Oropharynx is clear and moist. No oropharyngeal exudate or posterior oropharyngeal edema.  +mild pharyngeal erythema without exudate  Eyes: Pupils are equal, round, and reactive to light.  Neck: Normal range of motion.  Cardiovascular: Normal rate and regular rhythm.  Pulmonary/Chest: Effort normal. No stridor. No respiratory distress. She exhibits no deformity.  Comfortable work of breathing. No W/R/R  Abdominal: Soft. She exhibits no distension. There is no rebound and no guarding.  Musculoskeletal:       Right lower leg: She exhibits no edema.       Left lower leg: She exhibits  no edema.  Skin: Skin is warm and dry. Capillary refill takes less than 2 seconds.  Psychiatric: She has a normal mood and affect.    ED Treatments / Results  Labs (all labs ordered are listed, but only abnormal results are displayed) Labs Reviewed  BASIC METABOLIC PANEL - Abnormal; Notable for the following components:      Result Value   Calcium 8.8 (*)    All other components within normal limits  CBC - Abnormal; Notable for the following components:   Hemoglobin 11.8 (*)    RDW 15.6 (*)    All other components within normal limits  I-STAT TROPONIN, ED  I-STAT BETA HCG BLOOD, ED ([facility], [facility], [facility] ONLY)    EKG  EKG Interpretation  Date/Time:  Saturday June 20 2017 13:05:32 EST Ventricular Rate:  85 PR Interval:  130 QRS  Duration: 82 QT Interval:  342 QTC Calculation: 406 R Axis:   58 Text Interpretation:  Normal sinus rhythm T wave abnormality Abnormal ekg Confirmed by Gerhard Munch [phone]) on 06/20/2017 2:47:12 PM      Radiology Dg Chest 2 View  Result Date: 06/20/2017 CLINICAL DATA:  Productive cough, congestion, chest pressure and shortness of breath. EXAM: CHEST  2 VIEW COMPARISON:  None. FINDINGS: The heart size and mediastinal contours are within normal limits. There is no evidence of pulmonary edema, consolidation, pneumothorax, nodule or pleural fluid. The visualized skeletal structures are unremarkable. IMPRESSION: No active cardiopulmonary disease. Electronically Signed   By: Irish Lack M.D.   On: 06/20/2017 13:42    Procedures Procedures (including critical care time)  Medications Ordered in ED Medications - No data to display   Initial Impression / Assessment and Plan / ED Course  I have reviewed the triage vital signs and the nursing notes.  Pertinent labs & imaging results that were available during my care of the patient were reviewed by me and considered in my medical decision making (see chart for details).    42 year old with no PMH presents with subjective throat swelling and dyspnea, overall picture most consistent with an upper respiratory viral illness. No red flags on history or exam. Patient had taken doses of her son's prednisone for throat swelling at home, unimpressive on exam today. Recommend continuing mucinex, theraflu as needed for cough and post-nasal drip. Prednisone x4 day course was prescribed and return precautions were discussed with the patient. She was considered stable for discharge.  Final Clinical Impressions(s) / ED Diagnoses   Final diagnoses:  Viral upper respiratory illness    ED Discharge Orders        Ordered    predniSONE (DELTASONE) 20 MG tablet  Daily     06/20/17 1512       Howard Pouch, MD 06/20/17 1520    Gerhard Munch,  MD 06/21/17 1701

## 2017-06-20 NOTE — ED Notes (Signed)
Called pt for a room, no answer. 

## 2018-07-14 ENCOUNTER — Other Ambulatory Visit: Payer: Self-pay | Admitting: Nurse Practitioner

## 2018-07-14 DIAGNOSIS — Z1231 Encounter for screening mammogram for malignant neoplasm of breast: Secondary | ICD-10-CM

## 2018-08-13 ENCOUNTER — Inpatient Hospital Stay: Admission: RE | Admit: 2018-08-13 | Payer: Self-pay | Source: Ambulatory Visit

## 2018-10-13 ENCOUNTER — Ambulatory Visit: Payer: Self-pay

## 2018-12-01 ENCOUNTER — Ambulatory Visit
Admission: RE | Admit: 2018-12-01 | Discharge: 2018-12-01 | Disposition: A | Discharge status: Home / Self Care | Payer: 59 | Source: Ambulatory Visit | Attending: Nurse Practitioner | Admitting: Nurse Practitioner

## 2018-12-01 DIAGNOSIS — Z1231 Encounter for screening mammogram for malignant neoplasm of breast: Secondary | ICD-10-CM

## 2019-02-21 ENCOUNTER — Ambulatory Visit
Admission: EM | Admit: 2019-02-21 | Discharge: 2019-02-21 | Disposition: A | Discharge status: Home / Self Care | Payer: Medicaid Other | Attending: Physician Assistant | Admitting: Physician Assistant

## 2019-02-21 ENCOUNTER — Encounter: Payer: Self-pay | Admitting: Emergency Medicine

## 2019-02-21 ENCOUNTER — Other Ambulatory Visit: Payer: Self-pay

## 2019-02-21 DIAGNOSIS — H1031 Unspecified acute conjunctivitis, right eye: Secondary | ICD-10-CM

## 2019-02-21 MED ORDER — OFLOXACIN 0.3 % OP SOLN: OPHTHALMIC | 0 refills | Status: DC

## 2019-02-21 NOTE — Discharge Instructions (Signed)
Use ofloxacin eyedrops as directed on right eye. Refrain from contact lens use until course of antibiotics done and no symptoms. Monitor for any worsening of symptoms, changes in vision, sensitivity to light, eye swelling, painful eye movement, follow up with ophthalmology for further evaluation.

## 2019-02-21 NOTE — ED Triage Notes (Signed)
Pt presents to [facility] for assessment after putting mascara on Saturday, then developing eye redness, itchiness, discomfort Sunday morning.  Patient states she took her contact out Sunday and has not worn one in that eye since.  C/o discharge.

## 2019-02-21 NOTE — ED Notes (Signed)
Patient able to ambulate independently  

## 2019-02-21 NOTE — ED Provider Notes (Signed)
[facility]    CSN: [id number] Arrival date & time: 02/21/19  1151      History   Chief Complaint Chief Complaint  Patient presents with  . eye irritation    HPI Jill Mcdaniel is a 43 y.o. female.   43 year old female comes in for 2-day history of right eye pain, irritation, drainage.  Patient states this for started after putting on mascara, and wonders if that was the cause of symptoms.  However, since then she has had right eye irritation, crusting in the morning, eye redness.  Denies blurry vision, vision changes, photophobia.  Denies URI symptoms such as cough, congestion, sore throat.  Denies fever, chills, body aches.  She wears monthly disposable contacts, started a new pair of contacts when symptoms first started.  Since symptoms started, she has not worn contacts in the right eye. Has tried otc eye drops with minimal relief.      Past Medical History:  Diagnosis Date  . Anemia   . Blood transfusion without reported diagnosis 1997   anemia  . Seropositive for herpes simplex 2 infection   . Vaginal Pap smear, abnormal     Patient Active Problem List   Diagnosis Date Noted  . S/P cesarean section 04/07/2014    Past Surgical History:  Procedure Laterality Date  . Abortions    . CESAREAN SECTION    . CESAREAN SECTION WITH BILATERAL TUBAL LIGATION Bilateral 04/07/2014   Procedure: CESAREAN SECTION WITH BILATERAL TUBAL LIGATION;  Surgeon: Maeola Sarah. Landry Mellow, MD;  Location: [facility];  Service: Obstetrics;  Laterality: Bilateral;  . FRACTURE SURGERY      OB History    Gravida  6   Para  2   Term  2   Preterm      AB  4   Living  2     SAB      TAB  4   Ectopic      Multiple  0   Live Births  2            Home Medications    Prior to Admission medications   Medication Sig Start Date End Date Taking? Authorizing Provider  ofloxacin (OCUFLOX) 0.3 % ophthalmic solution 1 drop in right eye every 2 hours for the first 2 days, then 4  times a day for the next 5 days 02/21/19   Ok Edwards, PA-C    Family History History reviewed. No pertinent family history.  Social History Social History   Tobacco Use  . Smoking status: Former Smoker    Packs/day: 1.00    Types: Cigarettes  . Smokeless tobacco: Never Used  Substance Use Topics  . Alcohol use: No    Comment: socially  . Drug use: No     Allergies   Patient has no known allergies.   Review of Systems Review of Systems  Reason unable to perform ROS: See HPI as above.     Physical Exam Triage Vital Signs ED Triage Vitals  Enc Vitals Group     BP 02/21/19 1205 (!) 137/94     Pulse Rate 02/21/19 1205 74     Resp 02/21/19 1205 16     Temp 02/21/19 1205 98.5 F (36.9 C)     Temp Source 02/21/19 1205 Oral     SpO2 02/21/19 1205 98 %     Weight --      Height --      Head Circumference --  Peak Flow --      Pain Score 02/21/19 1213 7     Pain Loc --      Pain Edu? --      Excl. in GC? --    No data found.  Updated Vital Signs BP (!) 137/94 (BP Location: Left Arm)   Pulse 74   Temp 98.5 F (36.9 C) (Oral)   Resp 16   LMP 02/07/2019   SpO2 98%   Visual Acuity Right Eye Distance:   Left Eye Distance:   Bilateral Distance:    Right Eye Near: R Near: 20/400 ([place]) Left Eye Near:  L Near: 20/20 ([facility]) Bilateral Near:  20/20  Physical Exam Constitutional:      General: She is not in acute distress.    Appearance: She is well-developed. She is not ill-appearing, toxic-appearing or diaphoretic.  HENT:     Head: Normocephalic and atraumatic.  Eyes:     General: Lids are normal. Lids are everted, no foreign bodies appreciated.     Extraocular Movements: Extraocular movements intact.     Conjunctiva/sclera:     Right eye: Right conjunctiva is injected.     Left eye: Left conjunctiva is not injected.     Pupils: Pupils are equal, round, and reactive to light.     Comments: Fluorescein stain without uptake.  Neurological:     Mental  Status: She is alert and oriented to person, place, and time.      UC Treatments / Results  Labs (all labs ordered are listed, but only abnormal results are displayed) Labs Reviewed - No data to display  EKG   Radiology No results found.  Procedures Procedures (including critical care time)  Medications Ordered in UC Medications - No data to display  Initial Impression / Assessment and Plan / UC Course  I have reviewed the triage vital signs and the nursing notes.  Pertinent labs & imaging results that were available during my care of the patient were reviewed by me and considered in my medical decision making (see chart for details).    Fluorescein stain without uptake.  Will treat for bacterial conjunctivitis with ofloxacin.  Patient to refrain from contact lens use until symptoms resolve, and to throw away current contacts.  Return precautions given.  Patient expresses understanding and agrees to plan.  Final Clinical Impressions(s) / UC Diagnoses   Final diagnoses:  Acute conjunctivitis of right eye, unspecified acute conjunctivitis type   ED Prescriptions    Medication Sig Dispense Auth. Provider   ofloxacin (OCUFLOX) 0.3 % ophthalmic solution 1 drop in right eye every 2 hours for the first 2 days, then 4 times a day for the next 5 days 5 mL Belinda Fisher, PA-C     PDMP not reviewed this encounter.   Belinda Fisher, PA-C 02/21/19 1334

## 2020-12-18 ENCOUNTER — Other Ambulatory Visit: Payer: Self-pay

## 2020-12-18 DIAGNOSIS — Z1231 Encounter for screening mammogram for malignant neoplasm of breast: Secondary | ICD-10-CM

## 2020-12-20 ENCOUNTER — Other Ambulatory Visit: Payer: Self-pay

## 2020-12-20 ENCOUNTER — Ambulatory Visit
Admission: RE | Admit: 2020-12-20 | Discharge: 2020-12-20 | Disposition: A | Discharge status: Home / Self Care | Payer: Medicaid Other | Source: Ambulatory Visit | Attending: Nurse Practitioner | Admitting: Nurse Practitioner

## 2020-12-20 DIAGNOSIS — Z1231 Encounter for screening mammogram for malignant neoplasm of breast: Secondary | ICD-10-CM

## 2021-01-01 ENCOUNTER — Ambulatory Visit (HOSPITAL_COMMUNITY)
Admission: EM | Admit: 2021-01-01 | Discharge: 2021-01-01 | Disposition: A | Discharge status: Home / Self Care | Payer: Medicaid Other | Attending: Family Medicine | Admitting: Family Medicine

## 2021-01-01 ENCOUNTER — Encounter (HOSPITAL_COMMUNITY): Payer: Self-pay

## 2021-01-01 DIAGNOSIS — M25511 Pain in right shoulder: Secondary | ICD-10-CM

## 2021-01-01 MED ORDER — MELOXICAM 15 MG PO TBDP: 15.0000 mg | ORAL_TABLET | Freq: Every day | ORAL | 0 refills | Status: DC

## 2021-01-01 NOTE — ED Triage Notes (Signed)
Pt presents with ongoing right neck pain and right shoulder pain X 2 weeks.

## 2021-01-01 NOTE — ED Provider Notes (Signed)
[facility]    CSN: [id number] Arrival date & time: 01/01/21  1835      History   Chief Complaint Chief Complaint  Patient presents with   Shoulder Pain   Neck Pain    HPI Jill Mcdaniel is a 45 y.o. female.   Right Shoulder pain Started 3 weeks ago Also had pain in the right side of her neck, but this is gone now over the last few days as she increased some stretching She now has pain mostly on the lateral aspect of her right shoulder She states that when she drove this weekend she did find that she had some tingling from her elbow to her hands, but otherwise has not had numbness and tingling She denies any known injury She does report that overhead activity makes her pain worse She also reports that the pain is worse when she is trying to sleep She has been taking ibuprofen without much improvement in her pain She denies any weakness in her arm She states that when she does lay flat she feels the pain radiate into her mid back   Past Medical History:  Diagnosis Date   Anemia    Blood transfusion without reported diagnosis 1997   anemia   Seropositive for herpes simplex 2 infection    Vaginal Pap smear, abnormal     Patient Active Problem List   Diagnosis Date Noted   S/P cesarean section 04/07/2014    Past Surgical History:  Procedure Laterality Date   Abortions     CESAREAN SECTION     CESAREAN SECTION WITH BILATERAL TUBAL LIGATION Bilateral 04/07/2014   Procedure: CESAREAN SECTION WITH BILATERAL TUBAL LIGATION;  Surgeon: Dorien Chihuahua. Richardson Dopp, MD;  Location: [facility];  Service: Obstetrics;  Laterality: Bilateral;   FRACTURE SURGERY      OB History     Gravida  6   Para  2   Term  2   Preterm      AB  4   Living  2      SAB      IAB  4   Ectopic      Multiple  0   Live Births  2            Home Medications    Prior to Admission medications   Medication Sig Start Date End Date Taking? Authorizing Provider  Meloxicam 15 MG  TBDP Take 15 mg by mouth daily. 01/01/21  Yes Helyn Schwan, Solmon Ice, DO  ofloxacin (OCUFLOX) 0.3 % ophthalmic solution 1 drop in right eye every 2 hours for the first 2 days, then 4 times a day for the next 5 days 02/21/19   Belinda Fisher, PA-C    Family History Family History  Family history unknown: Yes    Social History Social History   Tobacco Use   Smoking status: Former    Packs/day: 1.00    Types: Cigarettes   Smokeless tobacco: Never  Substance Use Topics   Alcohol use: No    Comment: socially   Drug use: No     Allergies   Patient has no known allergies.   Review of Systems Review of Systems  Constitutional:  Negative for chills and fever.  HENT:  Negative for congestion.   Respiratory:  Negative for shortness of breath.   Cardiovascular:  Negative for chest pain.  Gastrointestinal:  Negative for abdominal pain.  Genitourinary:  Negative for difficulty urinating.  Musculoskeletal:  Positive for back  pain and neck pain. Negative for neck stiffness.       Right shoulder pain  Skin:  Negative for rash.  Neurological:  Negative for weakness, numbness and headaches.    Physical Exam Triage Vital Signs ED Triage Vitals [01/01/21 1914]  Enc Vitals Group     BP (!) 141/96     Pulse Rate 72     Resp 18     Temp 98 F (36.7 C)     Temp Source Oral     SpO2 93 %     Weight      Height      Head Circumference      Peak Flow      Pain Score 9     Pain Loc      Pain Edu?      Excl. in GC?    No data found.  Updated Vital Signs BP (!) 141/96 (BP Location: Left Arm)   Pulse 72   Temp 98 F (36.7 C) (Oral)   Resp 18   SpO2 93%   Visual Acuity Right Eye Distance:   Left Eye Distance:   Bilateral Distance:    Right Eye Near:   Left Eye Near:    Bilateral Near:     Physical Exam Constitutional:      Appearance: Normal appearance.  HENT:     Head: Normocephalic and atraumatic.  Cardiovascular:     Rate and Rhythm: Normal rate.  Pulmonary:      Effort: Pulmonary effort is normal. No respiratory distress.     Breath sounds: Normal breath sounds.  Musculoskeletal:     Cervical back: Normal range of motion and neck supple. No rigidity or tenderness.     Comments: Right Shoulder: Inspection reveals no obvious deformity, atrophy, or asymmetry b/l. No bruising. Some slight swelling over distal insertion of deltoid on right Palpation is normal with no TTP over [facility] joint or bicipital groove b/l. Full ROM in flexion, abduction, internal/external rotation b/l NV intact distally b/l Special Tests:  - Impingement: Positive Hawkins - Supraspinatous: Equivocal empty can with pain but no weakness - Infraspinatous/Teres Minor: 5/5 strength with ER - Subscapularis: 5/5 strength with IR - Biceps tendon: Positive Speeds, Negative Yerrgason's  - Labrum: Negative Obriens, good stability - AC Joint: Negative cross arm - Negative apprehension test - No painful arc and no drop arm sign  Neck/Back: - Inspection: no gross deformity or asymmetry, swelling or ecchymosis - Palpation: No TTP  spinous process, No TTP rhomboid and trapezius and no tender points - ROM: full active ROM of the cervical spine with neck extension, rotation, flexion  - Strength: 5/5 strength BUE - Neuro: sensation intact in the C5-C8 nerve root distribution b/l, 2+ C5-C7 reflexes - Special testing: negative spurling's      Lymphadenopathy:     Cervical: No cervical adenopathy.  Skin:    General: Skin is warm and dry.     Capillary Refill: Capillary refill takes less than 2 seconds.  Neurological:     Mental Status: She is alert and oriented to person, place, and time.     UC Treatments / Results  Labs (all labs ordered are listed, but only abnormal results are displayed) Labs Reviewed - No data to display  EKG   Radiology No results found.  Procedures Procedures (including critical care time)  Medications Ordered in UC Medications - No data to  display  Initial Impression / Assessment and Plan / UC Course  I  have reviewed the triage vital signs and the nursing notes.  Pertinent labs & imaging results that were available during my care of the patient were reviewed by me and considered in my medical decision making (see chart for details).     Patient is a 45 year old female with no significant past medical history who presents with right-sided shoulder and neck pain, neck pain is now resolved, now only has lateral right shoulder pain.  She does have some impingement noted on her testing as well as some signs of bicipital tendinitis.  Her symptoms are rather nonspecific, given that she was having pain in her back and in her neck, could consider that this is coming from her neck.  She did have a negative Spurling's bilaterally and is neurovascularly intact, therefore we will hold off on imaging today.  Discussed trial of subacromial bursa injection and follow-up with orthopedics versus anti-inflammatory medication and follow-up with orthopedics or sports medicine.  She opts to treat with oral anti-inflammatory medication at this time, Rx sent for meloxicam to take daily for the next week, then daily as needed.  We will also have her follow-up in the sports medicine office in 1 week if she does not have improvement in her symptoms.  She was given ED precautions, see AVS.  Patient was discharged home in stable condition.  Final Clinical Impressions(s) / UC Diagnoses   Final diagnoses:  Acute pain of right shoulder     Discharge Instructions      Take meloxicam for 7 days with food.  If you are improving after this, you don't need to follow up.  If you aren't having improvement, you can call the sports medicine office to be seen.  If you have significant worsening of your symptoms or you are unable to move either arm, you should see a medical provider right away.     ED Prescriptions     Medication Sig Dispense Auth. Provider    Meloxicam 15 MG TBDP Take 15 mg by mouth daily. 30 tablet Jaculin Rasmus, Solmon Ice, DO      PDMP not reviewed this encounter.   Unknown Jim, DO 01/01/21 1952

## 2021-01-01 NOTE — Discharge Instructions (Addendum)
Take meloxicam for 7 days with food.  If you are improving after this, you don't need to follow up.  If you aren't having improvement, you can call the sports medicine office to be seen.  If you have significant worsening of your symptoms or you are unable to move either arm, you should see a medical provider right away.

## 2021-01-02 ENCOUNTER — Telehealth (HOSPITAL_COMMUNITY): Payer: Self-pay

## 2021-01-03 MED ORDER — MELOXICAM 7.5 MG PO TABS: 15.0000 mg | ORAL_TABLET | Freq: Every day | ORAL | 0 refills | Status: DC

## 2021-03-09 ENCOUNTER — Emergency Department (HOSPITAL_COMMUNITY)
Admission: EM | Admit: 2021-03-09 | Discharge: 2021-03-09 | Disposition: A | Discharge status: Home / Self Care | Payer: Medicaid Other | Attending: Emergency Medicine | Admitting: Emergency Medicine

## 2021-03-09 ENCOUNTER — Emergency Department (HOSPITAL_COMMUNITY): Payer: Medicaid Other

## 2021-03-09 ENCOUNTER — Other Ambulatory Visit: Payer: Self-pay

## 2021-03-09 DIAGNOSIS — X58XXXA Exposure to other specified factors, initial encounter: Secondary | ICD-10-CM | POA: Insufficient documentation

## 2021-03-09 DIAGNOSIS — Y9289 Other specified places as the place of occurrence of the external cause: Secondary | ICD-10-CM | POA: Diagnosis not present

## 2021-03-09 DIAGNOSIS — M25562 Pain in left knee: Secondary | ICD-10-CM

## 2021-03-09 DIAGNOSIS — Z87891 Personal history of nicotine dependence: Secondary | ICD-10-CM | POA: Diagnosis not present

## 2021-03-09 DIAGNOSIS — Y9341 Activity, dancing: Secondary | ICD-10-CM | POA: Insufficient documentation

## 2021-03-09 MED ORDER — NAPROXEN 500 MG PO TABS
500.0000 mg | ORAL_TABLET | Freq: Once | ORAL | Status: AC
  Administered 2021-03-09: 500 mg via ORAL
  Filled 2021-03-09: qty 1

## 2021-03-09 NOTE — ED Provider Notes (Signed)
[facility] Emergency Department Provider Note MRN:  [id number]  Arrival date & time: 03/09/21     Chief Complaint   Knee Pain   History of Present Illness   Jill Mcdaniel is a 45 y.o. year-old female with no pertinent past medical history presenting to the ED with chief complaint of knee pain.  Location: Left knee Duration: Few hours Onset: Sudden Timing: Constant pain Description: Dull ache Severity: Severe Exacerbating/Alleviating Factors: Worse with motion or palpation Associated Symptoms: None Pertinent Negatives: No other trauma, no head trauma, no fever  Additional History: Injured the knee while dancing.  Has had multiple episodes of knee pain over the years  Review of Systems  A complete 10 system review of systems was obtained and all systems are negative except as noted in the HPI and PMH.   Patient's Health History    Past Medical History:  Diagnosis Date   Anemia    Blood transfusion without reported diagnosis 1997   anemia   Seropositive for herpes simplex 2 infection    Vaginal Pap smear, abnormal     Past Surgical History:  Procedure Laterality Date   Abortions     CESAREAN SECTION     CESAREAN SECTION WITH BILATERAL TUBAL LIGATION Bilateral 04/07/2014   Procedure: CESAREAN SECTION WITH BILATERAL TUBAL LIGATION;  Surgeon: Dorien Chihuahua. Richardson Dopp, MD;  Location: [facility];  Service: Obstetrics;  Laterality: Bilateral;   FRACTURE SURGERY      Family History  Family history unknown: Yes    Social History   Socioeconomic History   Marital status: Single    Spouse name: Not on file   Number of children: Not on file   Years of education: Not on file   Highest education level: Not on file  Occupational History   Not on file  Tobacco Use   Smoking status: Former    Packs/day: 1.00    Types: Cigarettes   Smokeless tobacco: Never  Substance and Sexual Activity   Alcohol use: No    Comment: socially   Drug use: No   Sexual  activity: Yes  Other Topics Concern   Not on file  Social History Narrative   Not on file   Social Determinants of Health   Financial Resource Strain: Not on file  Food Insecurity: Not on file  Transportation Needs: Not on file  Physical Activity: Not on file  Stress: Not on file  Social Connections: Not on file  Intimate Partner Violence: Not on file     Physical Exam   Vitals:   03/09/21 0256 03/09/21 0530  BP: 118/88 137/87  Pulse: 93 74  Resp: 18 18  Temp: 97.9 F (36.6 C)   SpO2: 99% 100%    CONSTITUTIONAL: Well-appearing, NAD NEURO:  Alert and oriented x 3, no focal deficits EYES:  eyes equal and reactive ENT/NECK:  no LAD, no JVD CARDIO: Regular rate, well-perfused, normal S1 and S2 PULM:  CTAB no wheezing or rhonchi GI/GU:  normal bowel sounds, non-distended, non-tender MSK/SPINE:  No gross deformities, no edema; pain elicited with range of motion of the left knee, no erythema, no increased warmth SKIN:  no rash, atraumatic PSYCH:  Appropriate speech and behavior  *Additional and/or pertinent findings included in MDM below  Diagnostic and Interventional Summary    EKG Interpretation  Date/Time:    Ventricular Rate:    PR Interval:    QRS Duration:   QT Interval:    QTC Calculation:   R  Axis:     Text Interpretation:         Labs Reviewed - No data to display  DG Knee Complete 4 Views Left  Final Result      Medications  naproxen (NAPROSYN) tablet 500 mg (500 mg Oral Given 03/09/21 0553)     Procedures  /  Critical Care Procedures  ED Course and Medical Decision Making  I have reviewed the triage vital signs, the nursing notes, and pertinent available records from the EMR.  Listed above are laboratory and imaging tests that I personally ordered, reviewed, and interpreted and then considered in my medical decision making (see below for details).  Suspect acute flare of osteoarthritis, supported by x-ray, no fracture.  No fever, no  increased warmth, highly doubt septic joint especially given the acute mechanism during activity.  Ligamentous injury possible but no significant laxity.  Neurovascularly intact, appropriate for discharge.       Elmer Sow. Pilar Plate, MD [facility] Emergency Medicine [facility] [email or web address]  Final Clinical Impressions(s) / ED Diagnoses     ICD-10-CM   1. Acute pain of left knee  M25.562       ED Discharge Orders     None        Discharge Instructions Discussed with and Provided to Patient:    Discharge Instructions      You were evaluated in the Emergency Department and after careful evaluation, we did not find any emergent condition requiring admission or further testing in the hospital.  Your exam/testing today is overall reassuring.  X-ray does not show any broken bones.  Recommend Tylenol or Motrin at home for pain and rest.  If pain not improved in 2 weeks, recommend follow-up with an orthopedic specialist.  Please return to the Emergency Department if you experience any worsening of your condition.   Thank you for allowing [place] to be a part of your care.       Sabas Sous, MD 03/09/21 [phone]

## 2021-03-09 NOTE — ED Triage Notes (Signed)
Pt was dancing and feels like she must have hurt her left knee because now she can't move it.

## 2021-03-09 NOTE — Discharge Instructions (Addendum)
You were evaluated in the Emergency Department and after careful evaluation, we did not find any emergent condition requiring admission or further testing in the hospital.  Your exam/testing today is overall reassuring.  X-ray does not show any broken bones.  Recommend Tylenol or Motrin at home for pain and rest.  If pain not improved in 2 weeks, recommend follow-up with an orthopedic specialist.  Please return to the Emergency Department if you experience any worsening of your condition.   Thank you for allowing [place] to be a part of your care.

## 2021-03-10 NOTE — H&P (Signed)
Jill Mcdaniel is an 45 y.o. 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  Pap smear/Colposcopy History: 01/10/21: Colposcopy - Cervical biopsy 9 o'clock benign ectocervical tissue, negative for dysplasia, viral effect, and malignancy  Cervical biopsy 12 o'clock CIN II  ECC: mucus, blood, and rare endocervical epithelial cells, non-diagnostic. 12/12/20: NILM/HRHPV positive (neg 16/18/45) 08/02/19: NILM/HRHPV positive (neg 16/18/45) 11/11/16: NILM 06/19/15: NILM/HRHPV positive 06/07/13: NILM/HRHPV positive 11/30/12: Colposcopy - Cervical biopsy 1 o'clock squamous mucosa with focal koilocytic atypia  Cervical biopsy 6 o'clock benign squamous mucosa, no dysplasia or malignancy  Cervical biopsy 8 o'clock transformation zone mucosa with koilocytic atypia  Cervical biopsy 10 o'clock CIN I  ECC detached dysplastic squamous mucosal fragment, benign endocervical mucosal fragments present 09/09/12: HSIL 05/21/10: Colposcopy - Cervical biopsy 6 o'clock CIN I  Cervical biopsy 12 o'clock CIN I  ECC benign 01/09/11: NILM 03/26/10: ASCUS/HRHPV positive  Patient Active Problem List   Diagnosis Date Noted   S/P cesarean section 04/07/2014    MEDICAL/FAMILY/SOCIAL HX: No LMP recorded. (Menstrual status: Irregular Periods).    Past Medical History:  Diagnosis Date   Anemia    Blood transfusion without reported diagnosis 1997   anemia   Seropositive for herpes simplex 2 infection    Vaginal Pap smear, abnormal     Past Surgical History:  Procedure Laterality Date   Abortions     CESAREAN SECTION     CESAREAN SECTION WITH BILATERAL TUBAL LIGATION Bilateral 04/07/2014   Procedure: CESAREAN SECTION WITH BILATERAL TUBAL LIGATION;  Surgeon: Dorien Chihuahua. Richardson Dopp, MD;  Location: [facility];  Service: Obstetrics;  Laterality: Bilateral;   FRACTURE SURGERY      Family History  Family history unknown: Yes    Social History:  reports that she has quit  smoking. Her smoking use included cigarettes. She smoked an average of 1 pack per day. She has never used smokeless tobacco. She reports that she does not drink alcohol and does not use drugs.  ALLERGIES/MEDS:  Allergies: No Known Allergies  No medications prior to admission.     Review of Systems  Constitutional: Negative.   HENT: Negative.    Eyes: Negative.   Respiratory: Negative.    Cardiovascular: Negative.   Gastrointestinal: Negative.   Genitourinary: Negative.   Musculoskeletal: Negative.   Skin: Negative.   Neurological: Negative.   Endo/Heme/Allergies: Negative.   Psychiatric/Behavioral: Negative.     There were no vitals taken for this visit. Gen:  NAD, pleasant and cooperative Cardio:  RRR Pulm:  CTAB, no wheezes/rales/rhonchi Abd:  Soft, non-distended, non-tender throughout, no rebound/guarding Ext:  No bilateral LE edema, no bilateral calf tenderness Pelvic: Labia - unremarkable, vagina - pink moist mucosa, no lesions or abnormal discharge, cervix - no discharge or lesions or CMT  No results found for this or any previous visit (from the past 24 hour(s)).  DG Knee Complete 4 Views Left  Result Date: 03/09/2021 CLINICAL DATA:  Left knee pain EXAM: LEFT KNEE - COMPLETE 4+ VIEW COMPARISON:  09/07/2007 FINDINGS: Patellofemoral degenerative changes are noted. No joint effusion is seen. No acute fracture or dislocation is noted. IMPRESSION: Mild degenerative change without acute abnormality. Electronically Signed   By: Alcide Clever M.D.   On: 03/09/2021 03:40     ASSESSMENT/PLAN: Jill Mcdaniel is a 45 y.o. 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  - Admit to [facility] - Admit labs (  CBC, T&S, COVID screen) - Diet:  Per anesthesia/ERAS pathway - IVF:  Per anesthesia - VTE Prophylaxis:  SCDs - Antibiotics: None - Anticipate same-day discharge home  Consents: The nature of the CKC is to surgically  destroy or remove the abnormal areas, and to send a piece of the tissue to the lab for analysis.  The procedure is diagnostic, and usually therapeutic.  This procedure holds risks of infection, bleeding requiring a blood transfusion, incomplete resolution of the abnormality requiring a future procedure or additional follow-up testing, inability to carry a pregnancy (cervical insufficiency), cervical stenosis (narrowing), blood clot (DVT or PE), and ultimately a risk of death.  Patient understands these risks and agrees to proceed with the above named procedure. Patient was consented for blood products.  The patient is aware that bleeding may result in the need for a blood transfusion which includes risk of transmission of HIV (1:2 million), Hepatitis C (1:2 million), and Hepatitis B (1:200 thousand) and transfusion reaction.  Patient voiced understanding of the above risks as well as understanding of indications for blood transfusion.    Steva Ready, DO [phone] (office)

## 2021-03-14 ENCOUNTER — Other Ambulatory Visit: Payer: Self-pay

## 2021-03-14 ENCOUNTER — Encounter (HOSPITAL_BASED_OUTPATIENT_CLINIC_OR_DEPARTMENT_OTHER): Payer: Self-pay | Admitting: Obstetrics and Gynecology

## 2021-03-21 ENCOUNTER — Encounter (HOSPITAL_BASED_OUTPATIENT_CLINIC_OR_DEPARTMENT_OTHER)
Admission: RE | Admit: 2021-03-21 | Discharge: 2021-03-21 | Disposition: A | Discharge status: Home / Self Care | Payer: Medicaid Other | Source: Ambulatory Visit | Attending: Obstetrics and Gynecology | Admitting: Obstetrics and Gynecology

## 2021-03-21 ENCOUNTER — Other Ambulatory Visit: Payer: Self-pay

## 2021-03-21 DIAGNOSIS — Z87891 Personal history of nicotine dependence: Secondary | ICD-10-CM | POA: Diagnosis not present

## 2021-03-21 DIAGNOSIS — N871 Moderate cervical dysplasia: Secondary | ICD-10-CM | POA: Diagnosis present

## 2021-03-21 DIAGNOSIS — D069 Carcinoma in situ of cervix, unspecified: Secondary | ICD-10-CM | POA: Diagnosis not present

## 2021-03-21 LAB — CBC
HCT: 27.4 % — ABNORMAL LOW (ref 36.0–46.0)
Hemoglobin: 8.2 g/dL — ABNORMAL LOW (ref 12.0–15.0)
MCH: 20.2 pg — ABNORMAL LOW (ref 26.0–34.0)
MCHC: 29.9 g/dL — ABNORMAL LOW (ref 30.0–36.0)
MCV: 67.5 fL — ABNORMAL LOW (ref 80.0–100.0)
Platelets: 348 10*3/uL (ref 150–400)
RBC: 4.06 MIL/uL (ref 3.87–5.11)
RDW: 19.3 % — ABNORMAL HIGH (ref 11.5–15.5)
WBC: 11 10*3/uL — ABNORMAL HIGH (ref 4.0–10.5)
nRBC: 0 % (ref 0.0–0.2)

## 2021-03-21 LAB — TYPE AND SCREEN
ABO/RH(D): A POS
Antibody Screen: NEGATIVE

## 2021-03-21 NOTE — Progress Notes (Signed)

## 2021-03-21 NOTE — Progress Notes (Signed)
Sent message reminding pt to come in for lab work today.  

## 2021-03-22 ENCOUNTER — Ambulatory Visit (HOSPITAL_BASED_OUTPATIENT_CLINIC_OR_DEPARTMENT_OTHER): Payer: Medicaid Other | Admitting: Certified Registered"

## 2021-03-22 ENCOUNTER — Encounter (HOSPITAL_BASED_OUTPATIENT_CLINIC_OR_DEPARTMENT_OTHER)
Admission: RE | Disposition: A | Discharge status: Home / Self Care | Payer: Self-pay | Source: Home / Self Care | Attending: Obstetrics and Gynecology

## 2021-03-22 ENCOUNTER — Ambulatory Visit (HOSPITAL_BASED_OUTPATIENT_CLINIC_OR_DEPARTMENT_OTHER)
Admission: RE | Admit: 2021-03-22 | Discharge: 2021-03-22 | Disposition: A | Discharge status: Home / Self Care | Payer: Medicaid Other | Attending: Obstetrics and Gynecology | Admitting: Obstetrics and Gynecology

## 2021-03-22 ENCOUNTER — Encounter (HOSPITAL_BASED_OUTPATIENT_CLINIC_OR_DEPARTMENT_OTHER): Payer: Self-pay | Admitting: Obstetrics and Gynecology

## 2021-03-22 DIAGNOSIS — N871 Moderate cervical dysplasia: Secondary | ICD-10-CM

## 2021-03-22 DIAGNOSIS — D069 Carcinoma in situ of cervix, unspecified: Secondary | ICD-10-CM | POA: Diagnosis not present

## 2021-03-22 DIAGNOSIS — Z87891 Personal history of nicotine dependence: Secondary | ICD-10-CM | POA: Diagnosis not present

## 2021-03-22 HISTORY — PX: CERVICAL CONIZATION W/BX: SHX1330

## 2021-03-22 LAB — POCT PREGNANCY, URINE: Preg Test, Ur: NEGATIVE

## 2021-03-22 SURGERY — CONE BIOPSY, CERVIX
Anesthesia: General | Site: Cervix

## 2021-03-22 MED ORDER — OXYCODONE HCL 5 MG PO TABS: 5.0000 mg | ORAL_TABLET | Freq: Once | ORAL | Status: DC | PRN

## 2021-03-22 MED ORDER — LIDOCAINE HCL (CARDIAC) PF 100 MG/5ML IV SOSY
PREFILLED_SYRINGE | INTRAVENOUS | Status: DC | PRN
  Administered 2021-03-22: 100 mg via INTRAVENOUS

## 2021-03-22 MED ORDER — MEPERIDINE HCL 25 MG/ML IJ SOLN: 6.2500 mg | INTRAMUSCULAR | Status: DC | PRN

## 2021-03-22 MED ORDER — MIDAZOLAM HCL 2 MG/2ML IJ SOLN
INTRAMUSCULAR | Status: AC
  Filled 2021-03-22: qty 2

## 2021-03-22 MED ORDER — PROMETHAZINE HCL 25 MG/ML IJ SOLN: 6.2500 mg | INTRAMUSCULAR | Status: DC | PRN

## 2021-03-22 MED ORDER — MIDAZOLAM HCL 2 MG/2ML IJ SOLN
INTRAMUSCULAR | Status: DC | PRN
  Administered 2021-03-22: 2 mg via INTRAVENOUS

## 2021-03-22 MED ORDER — DEXAMETHASONE SODIUM PHOSPHATE 10 MG/ML IJ SOLN
INTRAMUSCULAR | Status: AC
  Filled 2021-03-22: qty 1

## 2021-03-22 MED ORDER — PROPOFOL 10 MG/ML IV BOLUS
INTRAVENOUS | Status: AC
  Filled 2021-03-22: qty 20

## 2021-03-22 MED ORDER — LIDOCAINE-EPINEPHRINE (PF) 1 %-1:200000 IJ SOLN
INTRAMUSCULAR | Status: AC
  Filled 2021-03-22: qty 30

## 2021-03-22 MED ORDER — OXYCODONE HCL 5 MG/5ML PO SOLN: 5.0000 mg | Freq: Once | ORAL | Status: DC | PRN

## 2021-03-22 MED ORDER — LIDOCAINE 2% (20 MG/ML) 5 ML SYRINGE
INTRAMUSCULAR | Status: AC
  Filled 2021-03-22: qty 5

## 2021-03-22 MED ORDER — DEXAMETHASONE SODIUM PHOSPHATE 10 MG/ML IJ SOLN
INTRAMUSCULAR | Status: DC | PRN
  Administered 2021-03-22: 5 mg via INTRAVENOUS

## 2021-03-22 MED ORDER — IODINE STRONG (LUGOLS) 5 % PO SOLN
ORAL | Status: AC
  Filled 2021-03-22: qty 1

## 2021-03-22 MED ORDER — ONDANSETRON HCL 4 MG/2ML IJ SOLN
INTRAMUSCULAR | Status: AC
  Filled 2021-03-22: qty 2

## 2021-03-22 MED ORDER — PHENYLEPHRINE HCL (PRESSORS) 10 MG/ML IV SOLN
INTRAVENOUS | Status: DC | PRN
  Administered 2021-03-22 (×2): 120 ug via INTRAVENOUS

## 2021-03-22 MED ORDER — LACTATED RINGERS IV SOLN: INTRAVENOUS | Status: DC

## 2021-03-22 MED ORDER — AMISULPRIDE (ANTIEMETIC) 5 MG/2ML IV SOLN: 10.0000 mg | Freq: Once | INTRAVENOUS | Status: DC | PRN

## 2021-03-22 MED ORDER — ACETIC ACID 5 % SOLN
Status: AC
  Filled 2021-03-22: qty 50

## 2021-03-22 MED ORDER — OXYCODONE HCL 5 MG PO TABS
5.0000 mg | ORAL_TABLET | ORAL | 0 refills | Status: DC | PRN
Start: 2021-03-22 — End: 2021-04-23

## 2021-03-22 MED ORDER — FERRIC SUBSULFATE SOLN
Status: DC | PRN
  Administered 2021-03-22: 1 via TOPICAL

## 2021-03-22 MED ORDER — HYDROMORPHONE HCL 1 MG/ML IJ SOLN: 0.2500 mg | INTRAMUSCULAR | Status: DC | PRN

## 2021-03-22 MED ORDER — LACTATED RINGERS IV SOLN: INTRAVENOUS | Status: DC | PRN

## 2021-03-22 MED ORDER — FERRIC SUBSULFATE 259 MG/GM EX SOLN
CUTANEOUS | Status: AC
  Filled 2021-03-22: qty 8

## 2021-03-22 MED ORDER — FENTANYL CITRATE (PF) 100 MCG/2ML IJ SOLN
INTRAMUSCULAR | Status: AC
  Filled 2021-03-22: qty 2

## 2021-03-22 MED ORDER — ONDANSETRON HCL 4 MG/2ML IJ SOLN
INTRAMUSCULAR | Status: DC | PRN
  Administered 2021-03-22: 4 mg via INTRAVENOUS

## 2021-03-22 MED ORDER — PROPOFOL 10 MG/ML IV BOLUS
INTRAVENOUS | Status: DC | PRN
  Administered 2021-03-22: 200 mg via INTRAVENOUS

## 2021-03-22 MED ORDER — FENTANYL CITRATE (PF) 100 MCG/2ML IJ SOLN
INTRAMUSCULAR | Status: DC | PRN
  Administered 2021-03-22: 100 ug via INTRAVENOUS

## 2021-03-22 MED ORDER — IBUPROFEN 800 MG PO TABS: 800.0000 mg | ORAL_TABLET | Freq: Three times a day (TID) | ORAL | 0 refills | Status: DC | PRN

## 2021-03-22 MED ORDER — LIDOCAINE-EPINEPHRINE (PF) 1 %-1:200000 IJ SOLN
INTRAMUSCULAR | Status: DC | PRN
  Administered 2021-03-22: 10 mL

## 2021-03-22 SURGICAL SUPPLY — 28 items
BLADE SURG 11 STRL SS (BLADE) ×2 IMPLANT
CATH ROBINSON RED A/P 16FR (CATHETERS) ×2 IMPLANT
CNTNR URN SCR LID CUP LEK RST (MISCELLANEOUS) ×1 IMPLANT
CONT SPEC 4OZ STRL OR WHT (MISCELLANEOUS) ×2
ELECT BALL LEEP 5MM RED (ELECTRODE) ×2 IMPLANT
ELECT BLADE 6.5 EXT (BLADE) ×2 IMPLANT
ELECT REM PT RETURN 9FT ADLT (ELECTROSURGICAL) ×2
ELECTRODE REM PT RTRN 9FT ADLT (ELECTROSURGICAL) ×1 IMPLANT
GLOVE SURG ENC MOIS LTX SZ6.5 (GLOVE) ×2 IMPLANT
GLOVE SURG POLYISO LF SZ6.5 (GLOVE) ×2 IMPLANT
GLOVE SURG UNDER POLY LF SZ6.5 (GLOVE) ×4 IMPLANT
GLOVE SURG UNDER POLY LF SZ7 (GLOVE) ×2 IMPLANT
GOWN STRL REUS W/ TWL LRG LVL3 (GOWN DISPOSABLE) ×2 IMPLANT
GOWN STRL REUS W/TWL LRG LVL3 (GOWN DISPOSABLE) ×4
HEMOSTAT SURGICEL 2X14 (HEMOSTASIS) ×2 IMPLANT
NS IRRIG 1000ML POUR BTL (IV SOLUTION) ×2 IMPLANT
PACK VAGINAL MINOR WOMEN LF (CUSTOM PROCEDURE TRAY) ×2 IMPLANT
PAD OB MATERNITY 4.3X12.25 (PERSONAL CARE ITEMS) ×2 IMPLANT
PENCIL SMOKE EVACUATOR (MISCELLANEOUS) ×2 IMPLANT
SCOPETTES 8  STERILE (MISCELLANEOUS) ×2
SCOPETTES 8 STERILE (MISCELLANEOUS) ×1 IMPLANT
SPONGE SURGIFOAM ABS GEL 12-7 (HEMOSTASIS) IMPLANT
SUT SILK 2 0 SH (SUTURE) ×4 IMPLANT
SUT VIC AB 0 CT1 27 (SUTURE) ×4
SUT VIC AB 0 CT1 27XBRD ANBCTR (SUTURE) ×2 IMPLANT
TOWEL GREEN STERILE FF (TOWEL DISPOSABLE) ×2 IMPLANT
TUBE CONNECTING 20X1/4 (TUBING) ×2 IMPLANT
YANKAUER SUCT BULB TIP NO VENT (SUCTIONS) ×2 IMPLANT

## 2021-03-22 NOTE — Anesthesia Postprocedure Evaluation (Signed)
Anesthesia Post Note  Patient: Jill Mcdaniel  Procedure(s) Performed: COLD KNIFE CONIZATION CERVIX WITH BIOPSY (Cervix)     Patient location during evaluation: PACU Anesthesia Type: General Level of consciousness: awake and alert Pain management: pain level controlled Vital Signs Assessment: post-procedure vital signs reviewed and stable Respiratory status: spontaneous breathing, nonlabored ventilation, respiratory function stable and patient connected to nasal cannula oxygen Cardiovascular status: blood pressure returned to baseline and stable Postop Assessment: no apparent nausea or vomiting Anesthetic complications: no   No notable events documented.  Last Vitals:  Vitals:   03/22/21 1407 03/22/21 1417  BP:  128/82  Pulse: 75 68  Resp: 11 18  Temp:  36.8 C  SpO2: 100% 99%    Last Pain:  Vitals:   03/22/21 1417  TempSrc: Oral  PainSc: 3                  Trevor Iha

## 2021-03-22 NOTE — Anesthesia Preprocedure Evaluation (Signed)
Anesthesia Evaluation  Patient identified by MRN, date of birth, ID band Patient awake    Reviewed: Allergy & Precautions, H&P , NPO status , Patient's Chart, lab work & pertinent test results, reviewed documented beta blocker date and time   History of Anesthesia Complications Negative for: history of anesthetic complications  Airway Mallampati: III  TM Distance: >3 FB Neck ROM: full    Dental  (+) Teeth Intact   Pulmonary neg pulmonary ROS, former smoker,    breath sounds clear to auscultation       Cardiovascular hypertension (GHTN), Pt. on medications  Rhythm:regular Rate:Normal     Neuro/Psych negative neurological ROS  negative psych ROS   GI/Hepatic negative GI ROS, Neg liver ROS,   Endo/Other    Renal/GU negative Renal ROS  negative genitourinary   Musculoskeletal   Abdominal (+) + obese,   Peds  Hematology  (+) anemia , H/o blood transfusion 1997 (bleeding after TAB)   Anesthesia Other Findings   Reproductive/Obstetrics                             Anesthesia Physical  Anesthesia Plan  ASA: III  Anesthesia Plan: General   Post-op Pain Management:    Induction: Intravenous  PONV Risk Score and Plan: 2 and Ondansetron, Midazolam and Treatment may vary due to age or medical condition  Airway Management Planned: LMA  Additional Equipment:   Intra-op Plan:   Post-operative Plan: Extubation in OR  Informed Consent: I have reviewed the patients History and Physical, chart, labs and discussed the procedure including the risks, benefits and alternatives for the proposed anesthesia with the patient or authorized representative who has indicated his/her understanding and acceptance.       Plan Discussed with: Surgeon and CRNA  Anesthesia Plan Comments:         Anesthesia Quick Evaluation

## 2021-03-22 NOTE — Interval H&P Note (Signed)
History and Physical Interval Note:  03/22/2021 12:40 PM  Jill Mcdaniel  has presented today for surgery, with the diagnosis of Cervical Intraepithelial Neoplasia II.  The various methods of treatment have been discussed with the patient and family. After consideration of risks, benefits and other options for treatment, the patient has consented to  Procedure(s): CONIZATION CERVIX WITH BIOPSY (N/A) as a surgical intervention.  The patient's history has been reviewed, patient examined, no change in status, stable for surgery.  I have reviewed the patient's chart and labs.  Questions were answered to the patient's satisfaction.   Of note, patient has had new left ACL injury due to fall. S/p evaluation by Orthopaedics. Patient able to position in stirrups without issue. Patient demonstrated appropriate level of flexion without pain. She desires to proceed as scheduled.    Steva Ready

## 2021-03-22 NOTE — Anesthesia Procedure Notes (Signed)
Procedure Name: LMA Insertion Date/Time: 03/22/2021 1:12 PM Performed by: Karen Kitchens, CRNA Pre-anesthesia Checklist: Patient identified, Emergency Drugs available, Suction available and Patient being monitored Patient Re-evaluated:Patient Re-evaluated prior to induction Oxygen Delivery Method: Circle system utilized Preoxygenation: Pre-oxygenation with 100% oxygen Induction Type: IV induction Ventilation: Mask ventilation without difficulty LMA: LMA inserted LMA Size: 4.0 Number of attempts: 1 Airway Equipment and Method: Bite block Placement Confirmation: positive ETCO2, CO2 detector and breath sounds checked- equal and bilateral Tube secured with: Tape Dental Injury: Teeth and Oropharynx as per pre-operative assessment

## 2021-03-22 NOTE — Transfer of Care (Signed)
Immediate Anesthesia Transfer of Care Note  Patient: Jill Mcdaniel  Procedure(s) Performed: COLD KNIFE CONIZATION CERVIX WITH BIOPSY (Cervix)  Patient Location: PACU  Anesthesia Type:General  Level of Consciousness: awake, alert  and oriented  Airway & Oxygen Therapy: Patient Spontanous Breathing and Patient connected to face mask oxygen  Post-op Assessment: Report given to RN and Post -op Vital signs reviewed and stable  Post vital signs: Reviewed and stable  Last Vitals:  Vitals Value Taken Time  BP    Temp    Pulse    Resp    SpO2      Last Pain:  Vitals:   03/22/21 1119  TempSrc: Oral  PainSc: 0-No pain         Complications: No notable events documented.

## 2021-03-22 NOTE — Discharge Instructions (Signed)

## 2021-03-22 NOTE — Interval H&P Note (Signed)
History and Physical Interval Note:  03/22/2021 12:38 PM  Jill Mcdaniel  has presented today for surgery, with the diagnosis of Cervical Intraepithelial Neoplasia II.  The various methods of treatment have been discussed with the patient and family. After consideration of risks, benefits and other options for treatment, the patient has consented to  Procedure(s): CONIZATION CERVIX WITH BIOPSY (N/A) as a surgical intervention.  The patient's history has been reviewed, patient examined, no change in status, stable for surgery.  I have reviewed the patient's chart and labs.  Questions were answered to the patient's satisfaction.     Steva Ready

## 2021-03-22 NOTE — Op Note (Signed)
Pre Op Dx:   1. 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 Cold knife conization of the cervix 2. Endocervical curettings  Surgeon:  Dr. Steva Ready Assistants:  None Anesthesia:  General  EBL:  10cc  IVF:  400cc UOP:  Voided prior to arrival to OR  Drains:  None Specimen removed:  Cervical cone biopsy - sent to pathology  Endocervical curettings - sent to cytology Device(s) implanted:  None Case Type:  Clean-contaminated Findings: Normal-appearing cervix Complications: None Indications:  45 y.o.yo patient with abnormal Pap, cervical biopsy showing CIN II and non-diagnostic endocervical curettings. Disposition:  PACU  Description of Procedure:  After informed consent was obtained the patient was brought to the operating room.  She was placed in dorsal supine position and anesthesia was administered.  She was placed in dorsal lithotomy position and prepped and draped in the usual sterile fashion.  Her bladder was emptied prior to arrival to OR.  A preoperative timeout was called.  Two 0-Vicryl sutures were placed at 3 and 9 o'clock on the cervix for traction and hemostasis. A circumferential cervical incision was initiated and a traction suture was placed in the specimen, tagging the specimen at 12 o'clock.  The cone biopsy was completed sharply with scissors and passed off the field.  The bed of the cone biopsy was made hemostatic with electrosurgery and Monsel's.  A piece of Surgicel was then tied in place over the cone bed for additional hemostasis.  She was returned to dorsal supine position, awakened and extubated having appeared to tolerate the procedure well.  All counts were correct.  She was transferred to PACU in good condition.    Steva Ready, DO

## 2021-03-26 ENCOUNTER — Encounter (HOSPITAL_BASED_OUTPATIENT_CLINIC_OR_DEPARTMENT_OTHER): Payer: Self-pay | Admitting: Obstetrics and Gynecology

## 2021-04-04 LAB — SURGICAL PATHOLOGY

## 2021-04-23 ENCOUNTER — Other Ambulatory Visit: Payer: Self-pay

## 2021-04-23 ENCOUNTER — Encounter (HOSPITAL_BASED_OUTPATIENT_CLINIC_OR_DEPARTMENT_OTHER): Payer: Self-pay | Admitting: Orthopaedic Surgery

## 2021-04-30 ENCOUNTER — Encounter (HOSPITAL_BASED_OUTPATIENT_CLINIC_OR_DEPARTMENT_OTHER)
Admission: RE | Admit: 2021-04-30 | Discharge: 2021-04-30 | Disposition: A | Discharge status: Home / Self Care | Payer: Medicaid Other | Source: Ambulatory Visit | Attending: Orthopaedic Surgery | Admitting: Orthopaedic Surgery

## 2021-04-30 DIAGNOSIS — Z01818 Encounter for other preprocedural examination: Secondary | ICD-10-CM | POA: Diagnosis present

## 2021-04-30 LAB — POCT PREGNANCY, URINE: Preg Test, Ur: NEGATIVE

## 2021-04-30 NOTE — H&P (Signed)
PREOPERATIVE H&P  Chief Complaint: left knee PCL, MCL tear  HPI: Jill Mcdaniel is a 45 y.o. female who is scheduled for, Procedure(s): KNEE ARTHROSCOPY WITH POSTERIOR CRUCIATE LIGAMENT (PCL) RECONSTRUCTION KNEE ARTHROSCOPY WITH MEDIAL COLLATERAL LIGAMENT RECONSTRUCTION.   Jill Mcdaniel is a healthy 45 year-old female who comes into the office with concerns about her left knee.  She was dancing Friday, made an awkward misstep and felt her knee buckle and give way.  Swelling that evening.  Limping through the weekend.  She was seen in a local emergency room and x-rays were negative  Her symptoms are rated as moderate to severe, and have been worsening.  This is significantly impairing activities of daily living.    Please see clinic note for further details on this patient's care.    She has elected for surgical management.   Past Medical History:  Diagnosis Date   Anemia    Blood transfusion without reported diagnosis 1997   anemia   Seropositive for herpes simplex 2 infection    Vaginal Pap smear, abnormal    Past Surgical History:  Procedure Laterality Date   Abortions     CERVICAL CONIZATION W/BX N/A 03/22/2021   Procedure: COLD KNIFE CONIZATION CERVIX WITH BIOPSY;  Surgeon: Steva Ready, DO;  Location: [facility];  Service: Gynecology;  Laterality: N/A;   CESAREAN SECTION     CESAREAN SECTION WITH BILATERAL TUBAL LIGATION Bilateral 04/07/2014   Procedure: CESAREAN SECTION WITH BILATERAL TUBAL LIGATION;  Surgeon: Dorien Chihuahua. Richardson Dopp, MD;  Location: [facility];  Service: Obstetrics;  Laterality: Bilateral;   FRACTURE SURGERY     Social History   Socioeconomic History   Marital status: Single    Spouse name: Not on file   Number of children: Not on file   Years of education: Not on file   Highest education level: Not on file  Occupational History   Not on file  Tobacco Use   Smoking status: Every Day    Packs/day: 0.25    Types: Cigarettes   Smokeless  tobacco: Never  Vaping Use   Vaping Use: Never used  Substance and Sexual Activity   Alcohol use: No    Comment: socially   Drug use: No   Sexual activity: Yes  Other Topics Concern   Not on file  Social History Narrative   Not on file   Social Determinants of Health   Financial Resource Strain: Not on file  Food Insecurity: Not on file  Transportation Needs: Not on file  Physical Activity: Not on file  Stress: Not on file  Social Connections: Not on file   Family History  Family history unknown: Yes   No Known Allergies Prior to Admission medications   Medication Sig Start Date End Date Taking? Authorizing Provider  Black Currant Seed Oil 500 MG CAPS Take by mouth.   Yes [provider]  ferrous sulfate 325 (65 FE) MG tablet Take 325 mg by mouth daily with breakfast.   Yes [provider]  NON FORMULARY    Yes [provider]    ROS: All other systems have been reviewed and were otherwise negative with the exception of those mentioned in the HPI and as above.  Physical Exam: General: Alert, no acute distress Cardiovascular: No pedal edema Respiratory: No cyanosis, no use of accessory musculature GI: No organomegaly, abdomen is soft and non-tender Skin: No lesions in the area of chief complaint Neurologic: Sensation intact distally Psychiatric: Patient is  competent for consent with normal mood and affect Lymphatic: No axillary or cervical lymphadenopathy  MUSCULOSKELETAL:  Range of motion of the left knee is limited to 90 degrees secondary to pain.  She has a grossly unstable knee with a valgus force across.  She has a Grade II PCL, but her exam is somewhat limited due to patient discomfort.  Imaging: MRI demonstrates a complete tear of the PCL and avulsion of the MCL proximally.  Assessment: left knee PCL, MCL tear  Plan: Plan for Procedure(s): KNEE ARTHROSCOPY WITH POSTERIOR CRUCIATE LIGAMENT (PCL) RECONSTRUCTION KNEE ARTHROSCOPY  WITH MEDIAL COLLATERAL LIGAMENT RECONSTRUCTION  The risks benefits and alternatives were discussed with the patient including but not limited to the risks of nonoperative treatment, versus surgical intervention including infection, bleeding, nerve injury,  blood clots, cardiopulmonary complications, morbidity, mortality, among others, and they were willing to proceed.   The patient acknowledged the explanation, agreed to proceed with the plan and consent was signed.   Operative Plan: Left knee scope with MCL repair versus reconstruction, and PCL repair versus reconstruction Discharge Medications: Standard DVT Prophylaxis: Aspirin Physical Therapy: Outpatient PT Special Discharge needs: Knee immobilizer. IceMan   Vernetta Honey, PA-C  04/30/2021 5:24 PM

## 2021-04-30 NOTE — Progress Notes (Signed)

## 2021-05-01 ENCOUNTER — Encounter (HOSPITAL_BASED_OUTPATIENT_CLINIC_OR_DEPARTMENT_OTHER)
Admission: RE | Disposition: A | Discharge status: Home / Self Care | Payer: Self-pay | Source: Ambulatory Visit | Attending: Orthopaedic Surgery

## 2021-05-01 ENCOUNTER — Other Ambulatory Visit: Payer: Self-pay

## 2021-05-01 ENCOUNTER — Ambulatory Visit (HOSPITAL_BASED_OUTPATIENT_CLINIC_OR_DEPARTMENT_OTHER): Payer: Medicaid Other | Admitting: Anesthesiology

## 2021-05-01 ENCOUNTER — Ambulatory Visit (HOSPITAL_BASED_OUTPATIENT_CLINIC_OR_DEPARTMENT_OTHER): Payer: Medicaid Other

## 2021-05-01 ENCOUNTER — Encounter (HOSPITAL_BASED_OUTPATIENT_CLINIC_OR_DEPARTMENT_OTHER): Payer: Self-pay | Admitting: Orthopaedic Surgery

## 2021-05-01 ENCOUNTER — Ambulatory Visit (HOSPITAL_BASED_OUTPATIENT_CLINIC_OR_DEPARTMENT_OTHER)
Admission: RE | Admit: 2021-05-01 | Discharge: 2021-05-01 | Disposition: A | Discharge status: Home / Self Care | Payer: Medicaid Other | Source: Ambulatory Visit | Attending: Orthopaedic Surgery | Admitting: Orthopaedic Surgery

## 2021-05-01 DIAGNOSIS — S83242A Other tear of medial meniscus, current injury, left knee, initial encounter: Secondary | ICD-10-CM | POA: Insufficient documentation

## 2021-05-01 DIAGNOSIS — E669 Obesity, unspecified: Secondary | ICD-10-CM | POA: Insufficient documentation

## 2021-05-01 DIAGNOSIS — Z6835 Body mass index (BMI) 35.0-35.9, adult: Secondary | ICD-10-CM | POA: Diagnosis not present

## 2021-05-01 DIAGNOSIS — S83522A Sprain of posterior cruciate ligament of left knee, initial encounter: Secondary | ICD-10-CM | POA: Insufficient documentation

## 2021-05-01 DIAGNOSIS — Y9341 Activity, dancing: Secondary | ICD-10-CM | POA: Diagnosis not present

## 2021-05-01 DIAGNOSIS — X501XXA Overexertion from prolonged static or awkward postures, initial encounter: Secondary | ICD-10-CM | POA: Insufficient documentation

## 2021-05-01 DIAGNOSIS — F17200 Nicotine dependence, unspecified, uncomplicated: Secondary | ICD-10-CM | POA: Insufficient documentation

## 2021-05-01 DIAGNOSIS — D649 Anemia, unspecified: Secondary | ICD-10-CM

## 2021-05-01 HISTORY — PX: KNEE ARTHROSCOPY WITH MEDIAL COLLATERAL LIGAMENT RECONSTRUCTION: SHX5650

## 2021-05-01 HISTORY — PX: KNEE ARTHROSCOPY WITH POSTERIOR CRUCIATE LIGAMENT (PCL) RECONSTRUCTION: SHX5657

## 2021-05-01 SURGERY — ARTHROSCOPY, KNEE, WITH PCL RECONSTRUCTION
Anesthesia: General | Site: Knee | Laterality: Left

## 2021-05-01 MED ORDER — DEXAMETHASONE SODIUM PHOSPHATE 10 MG/ML IJ SOLN
INTRAMUSCULAR | Status: AC
  Filled 2021-05-01: qty 1

## 2021-05-01 MED ORDER — OXYCODONE HCL 5 MG PO TABS: ORAL_TABLET | ORAL | 0 refills | Status: AC

## 2021-05-01 MED ORDER — KETOROLAC TROMETHAMINE 30 MG/ML IJ SOLN
INTRAMUSCULAR | Status: AC
  Filled 2021-05-01: qty 1

## 2021-05-01 MED ORDER — ASPIRIN 81 MG PO CHEW: 81.0000 mg | CHEWABLE_TABLET | Freq: Two times a day (BID) | ORAL | 0 refills | Status: AC

## 2021-05-01 MED ORDER — KETOROLAC TROMETHAMINE 30 MG/ML IJ SOLN
INTRAMUSCULAR | Status: DC | PRN
  Administered 2021-05-01: 30 mg via INTRAVENOUS

## 2021-05-01 MED ORDER — FENTANYL CITRATE (PF) 100 MCG/2ML IJ SOLN
INTRAMUSCULAR | Status: AC
  Filled 2021-05-01: qty 2

## 2021-05-01 MED ORDER — HYDROMORPHONE HCL 1 MG/ML IJ SOLN
INTRAMUSCULAR | Status: AC
  Filled 2021-05-01: qty 0.5

## 2021-05-01 MED ORDER — CEFAZOLIN SODIUM-DEXTROSE 2-4 GM/100ML-% IV SOLN
2.0000 g | INTRAVENOUS | Status: AC
  Administered 2021-05-01: 2 g via INTRAVENOUS

## 2021-05-01 MED ORDER — HYDROMORPHONE HCL 1 MG/ML IJ SOLN
0.2500 mg | INTRAMUSCULAR | Status: DC | PRN
  Administered 2021-05-01 (×3): 0.5 mg via INTRAVENOUS

## 2021-05-01 MED ORDER — LIDOCAINE 2% (20 MG/ML) 5 ML SYRINGE
INTRAMUSCULAR | Status: AC
  Filled 2021-05-01: qty 5

## 2021-05-01 MED ORDER — OXYCODONE HCL 5 MG PO TABS: 5.0000 mg | ORAL_TABLET | Freq: Once | ORAL | Status: DC | PRN

## 2021-05-01 MED ORDER — ONDANSETRON HCL 4 MG/2ML IJ SOLN
INTRAMUSCULAR | Status: AC
  Filled 2021-05-01: qty 2

## 2021-05-01 MED ORDER — ONDANSETRON HCL 4 MG/2ML IJ SOLN: 4.0000 mg | Freq: Once | INTRAMUSCULAR | Status: DC | PRN

## 2021-05-01 MED ORDER — PROPOFOL 10 MG/ML IV BOLUS
INTRAVENOUS | Status: AC
  Filled 2021-05-01: qty 20

## 2021-05-01 MED ORDER — ONDANSETRON HCL 4 MG/2ML IJ SOLN
INTRAMUSCULAR | Status: DC | PRN
  Administered 2021-05-01: 4 mg via INTRAVENOUS

## 2021-05-01 MED ORDER — PROPOFOL 10 MG/ML IV BOLUS
INTRAVENOUS | Status: DC | PRN
  Administered 2021-05-01: 160 mg via INTRAVENOUS

## 2021-05-01 MED ORDER — LACTATED RINGERS IV SOLN: INTRAVENOUS | Status: DC

## 2021-05-01 MED ORDER — HYDROMORPHONE HCL 1 MG/ML IJ SOLN
INTRAMUSCULAR | Status: DC | PRN
  Administered 2021-05-01 (×2): .5 mg via INTRAVENOUS

## 2021-05-01 MED ORDER — CELECOXIB 100 MG PO CAPS: 100.0000 mg | ORAL_CAPSULE | Freq: Two times a day (BID) | ORAL | 0 refills | Status: AC

## 2021-05-01 MED ORDER — ONDANSETRON HCL 4 MG PO TABS: 4.0000 mg | ORAL_TABLET | Freq: Three times a day (TID) | ORAL | 0 refills | Status: AC | PRN

## 2021-05-01 MED ORDER — LIDOCAINE HCL (CARDIAC) PF 100 MG/5ML IV SOSY
PREFILLED_SYRINGE | INTRAVENOUS | Status: DC | PRN
  Administered 2021-05-01: 80 mg via INTRATRACHEAL

## 2021-05-01 MED ORDER — HYDROMORPHONE HCL 1 MG/ML IJ SOLN
INTRAMUSCULAR | Status: AC
  Filled 2021-05-01: qty 1

## 2021-05-01 MED ORDER — OXYCODONE HCL 5 MG/5ML PO SOLN: 5.0000 mg | Freq: Once | ORAL | Status: DC | PRN

## 2021-05-01 MED ORDER — SODIUM CHLORIDE 0.9 % IR SOLN
Status: DC | PRN
  Administered 2021-05-01: 3000 mL

## 2021-05-01 MED ORDER — FENTANYL CITRATE (PF) 100 MCG/2ML IJ SOLN
INTRAMUSCULAR | Status: DC | PRN
  Administered 2021-05-01 (×2): 25 ug via INTRAVENOUS
  Administered 2021-05-01 (×3): 50 ug via INTRAVENOUS

## 2021-05-01 MED ORDER — VANCOMYCIN HCL 1000 MG IV SOLR
INTRAVENOUS | Status: DC | PRN
  Administered 2021-05-01: 1000 mg via TOPICAL

## 2021-05-01 MED ORDER — MIDAZOLAM HCL 5 MG/5ML IJ SOLN
INTRAMUSCULAR | Status: DC | PRN
  Administered 2021-05-01: 2 mg via INTRAVENOUS

## 2021-05-01 MED ORDER — MIDAZOLAM HCL 2 MG/2ML IJ SOLN
INTRAMUSCULAR | Status: AC
  Filled 2021-05-01: qty 2

## 2021-05-01 MED ORDER — BUPIVACAINE HCL (PF) 0.25 % IJ SOLN
INTRAMUSCULAR | Status: DC | PRN
  Administered 2021-05-01: 30 mL

## 2021-05-01 MED ORDER — DEXAMETHASONE SODIUM PHOSPHATE 10 MG/ML IJ SOLN
INTRAMUSCULAR | Status: DC | PRN
  Administered 2021-05-01: 5 mg via INTRAVENOUS

## 2021-05-01 MED ORDER — ACETAMINOPHEN 500 MG PO TABS: 1000.0000 mg | ORAL_TABLET | Freq: Three times a day (TID) | ORAL | 0 refills | Status: AC

## 2021-05-01 SURGICAL SUPPLY — 95 items
ALLOGRAFT GRFTLNK IMPLANT SYST (Anchor) ×1 IMPLANT
ANCH SUT SWLK 19.1X4.75 VT (Anchor) ×1 IMPLANT
ANCHOR BUTTON TIGHTROPE RN 14 (Anchor) ×2 IMPLANT
ANCHOR PEEK 4.75X19.1 SWLK C (Anchor) ×2 IMPLANT
APL PRP STRL LF DISP 70% ISPRP (MISCELLANEOUS) ×1
BANDAGE ESMARK 6X9 LF (GAUZE/BANDAGES/DRESSINGS) IMPLANT
BLADE AVERAGE 25X9 (BLADE) IMPLANT
BLADE HEX COATED 2.75 (ELECTRODE) ×2 IMPLANT
BLADE SHAVER BONE 5.0X13 (MISCELLANEOUS) ×2 IMPLANT
BLADE SURG 10 STRL SS (BLADE) ×2 IMPLANT
BLADE SURG 15 STRL LF DISP TIS (BLADE) ×1 IMPLANT
BLADE SURG 15 STRL SS (BLADE) ×2
BNDG CMPR 9X6 STRL LF SNTH (GAUZE/BANDAGES/DRESSINGS)
BNDG COHESIVE 4X5 TAN ST LF (GAUZE/BANDAGES/DRESSINGS) IMPLANT
BNDG ELASTIC 6X5.8 VLCR STR LF (GAUZE/BANDAGES/DRESSINGS) ×2 IMPLANT
BNDG ESMARK 6X9 LF (GAUZE/BANDAGES/DRESSINGS)
BURR OVAL 8 FLU 4.0X13 (MISCELLANEOUS) IMPLANT
CANNULA PASSPORT BUTTON 10-40 (CANNULA) IMPLANT
CANNULA TWIST IN 8.25X7CM (CANNULA) ×2 IMPLANT
CANNULA TWIST IN 8.25X9CM (CANNULA) ×2 IMPLANT
CHLORAPREP W/TINT 26 (MISCELLANEOUS) ×2 IMPLANT
CLSR STERI-STRIP ANTIMIC 1/2X4 (GAUZE/BANDAGES/DRESSINGS) IMPLANT
COOLER ICEMAN CLASSIC (MISCELLANEOUS) ×2 IMPLANT
COVER BACK TABLE 60X90IN (DRAPES) ×2 IMPLANT
CUFF TOURN SGL QUICK 34 (TOURNIQUET CUFF)
CUFF TRNQT CYL 34X4.125X (TOURNIQUET CUFF) IMPLANT
DECANTER SPIKE VIAL GLASS SM (MISCELLANEOUS) IMPLANT
DISSECTOR 3.5MM X 13CM CVD (MISCELLANEOUS) IMPLANT
DISSECTOR 4.0MMX13CM CVD (MISCELLANEOUS) ×2 IMPLANT
DRAIN PENROSE 12X.25 LTX STRL (MISCELLANEOUS) IMPLANT
DRAPE ARTHROSCOPY W/POUCH 90 (DRAPES) ×2 IMPLANT
DRAPE IMP U-DRAPE 54X76 (DRAPES) IMPLANT
DRAPE OEC MINIVIEW 54X84 (DRAPES) ×2 IMPLANT
DRAPE TOP ARMCOVERS (MISCELLANEOUS) ×2 IMPLANT
DRAPE U-SHAPE 47X51 STRL (DRAPES) ×2 IMPLANT
DRAPE-T ARTHROSCOPY W/POUCH (DRAPES) ×2 IMPLANT
ELECT REM PT RETURN 9FT ADLT (ELECTROSURGICAL) ×2
ELECTRODE REM PT RTRN 9FT ADLT (ELECTROSURGICAL) ×1 IMPLANT
FIBER TAPE 2MM (SUTURE) IMPLANT
FIBERSTICK 2 (SUTURE) IMPLANT
GAUZE SPONGE 4X4 12PLY STRL (GAUZE/BANDAGES/DRESSINGS) ×2 IMPLANT
GLOVE SRG 8 PF TXTR STRL LF DI (GLOVE) ×1 IMPLANT
GLOVE SURG ENC MOIS LTX SZ6.5 (GLOVE) ×2 IMPLANT
GLOVE SURG LTX SZ8 (GLOVE) ×4 IMPLANT
GLOVE SURG POLYISO LF SZ6.5 (GLOVE) ×2 IMPLANT
GLOVE SURG UNDER POLY LF SZ6.5 (GLOVE) ×4 IMPLANT
GLOVE SURG UNDER POLY LF SZ7 (GLOVE) ×2 IMPLANT
GLOVE SURG UNDER POLY LF SZ8 (GLOVE) ×2
GOWN STRL REUS W/ TWL LRG LVL3 (GOWN DISPOSABLE) ×2 IMPLANT
GOWN STRL REUS W/TWL LRG LVL3 (GOWN DISPOSABLE) ×4
GOWN STRL REUS W/TWL XL LVL3 (GOWN DISPOSABLE) ×2 IMPLANT
IMMOBILIZER KNEE 22 UNIV (SOFTGOODS) IMPLANT
IMMOBILIZER KNEE 24 THIGH 36 (MISCELLANEOUS) IMPLANT
IMMOBILIZER KNEE 24 UNIV (MISCELLANEOUS)
IV NS IRRIG 3000ML ARTHROMATIC (IV SOLUTION) ×8 IMPLANT
KIT LEG STABILIZATION (KITS) IMPLANT
KIT TRANSTIBIAL (DISPOSABLE) IMPLANT
MANIFOLD NEPTUNE II (INSTRUMENTS) ×2 IMPLANT
NDL SAFETY ECLIPSE 18X1.5 (NEEDLE) IMPLANT
NDL SUT 6 .5 CRC .975X.05 MAYO (NEEDLE) ×1 IMPLANT
NEEDLE HYPO 18GX1.5 SHARP (NEEDLE)
NEEDLE MAYO TAPER (NEEDLE) ×2
NS IRRIG 1000ML POUR BTL (IV SOLUTION) ×2 IMPLANT
PACK ARTHROSCOPY DSU (CUSTOM PROCEDURE TRAY) ×2 IMPLANT
PACK BASIN DAY SURGERY FS (CUSTOM PROCEDURE TRAY) ×2 IMPLANT
PAD CAST 4YDX4 CTTN HI CHSV (CAST SUPPLIES) ×1 IMPLANT
PAD COLD SHLDR WRAP-ON (PAD) ×2 IMPLANT
PADDING CAST COTTON 4X4 STRL (CAST SUPPLIES) ×2
PENCIL SMOKE EVACUATOR (MISCELLANEOUS) ×2 IMPLANT
PK GRAFTLINK ALLO IMPLANT SYST (Anchor) ×2 IMPLANT
PORT APPOLLO RF 90DEGREE MULTI (SURGICAL WAND) ×2 IMPLANT
SHEET MEDIUM DRAPE 40X70 STRL (DRAPES) ×2 IMPLANT
SLEEVE SCD COMPRESS KNEE MED (STOCKING) ×2 IMPLANT
SPONGE T-LAP 4X18 ~~LOC~~+RFID (SPONGE) ×2 IMPLANT
STRIP CLOSURE SKIN 1/2X4 (GAUZE/BANDAGES/DRESSINGS) ×4 IMPLANT
SUT FIBERWIRE #2 38 T-5 BLUE (SUTURE) ×8
SUT MNCRL AB 3-0 PS2 18 (SUTURE) IMPLANT
SUT MNCRL AB 4-0 PS2 18 (SUTURE) ×2 IMPLANT
SUT VIC AB 0 CT1 27 (SUTURE) ×2
SUT VIC AB 0 CT1 27XBRD ANBCTR (SUTURE) ×1 IMPLANT
SUT VIC AB 2-0 SH 27 (SUTURE)
SUT VIC AB 2-0 SH 27XBRD (SUTURE) IMPLANT
SUT VIC AB 3-0 SH 27 (SUTURE) ×2
SUT VIC AB 3-0 SH 27X BRD (SUTURE) ×1 IMPLANT
SUTURE FIBERWR #2 38 T-5 BLUE (SUTURE) ×4 IMPLANT
SUTURE TAPE 1.3 FIBERLOP 20 ST (SUTURE) IMPLANT
SUTURETAPE 1.3 FIBERLOOP 20 ST (SUTURE)
SYR 5ML LL (SYRINGE) IMPLANT
SYS INTERNAL BRACE KNEE (Miscellaneous) ×2 IMPLANT
SYSTEM INTERNAL BRACE KNEE (Miscellaneous) ×1 IMPLANT
TISSUE GRAFTLINK 65-95MML (Tissue) ×2 IMPLANT
TOWEL GREEN STERILE FF (TOWEL DISPOSABLE) ×6 IMPLANT
TUBE SUCTION HIGH CAP CLEAR NV (SUCTIONS) ×2 IMPLANT
TUBING ARTHROSCOPY IRRIG 16FT (MISCELLANEOUS) ×2 IMPLANT
WRAP KNEE MAXI GEL POST OP (GAUZE/BANDAGES/DRESSINGS) IMPLANT

## 2021-05-01 NOTE — Anesthesia Preprocedure Evaluation (Addendum)
Anesthesia Evaluation  Patient identified by MRN, date of birth, ID band Patient awake    Reviewed: Allergy & Precautions, NPO status , Patient's Chart, lab work & pertinent test results  Airway Mallampati: II  TM Distance: >3 FB Neck ROM: Full    Dental no notable dental hx. (+) Teeth Intact, Dental Advisory Given   Pulmonary Current Smoker,    Pulmonary exam normal breath sounds clear to auscultation       Cardiovascular negative cardio ROS Normal cardiovascular exam Rhythm:Regular Rate:Normal     Neuro/Psych negative neurological ROS  negative psych ROS   GI/Hepatic negative GI ROS, Neg liver ROS,   Endo/Other  Obesity  Renal/GU negative Renal ROS  negative genitourinary   Musculoskeletal Left knee PCL and MCL tear   Abdominal (+) + obese,   Peds  Hematology  (+) anemia ,   Anesthesia Other Findings   Reproductive/Obstetrics                            Anesthesia Physical Anesthesia Plan  ASA: 2  Anesthesia Plan: General   Post-op Pain Management:    Induction: Intravenous  PONV Risk Score and Plan: 3 and Treatment may vary due to age or medical condition, Midazolam, Ondansetron and Dexamethasone  Airway Management Planned: LMA  Additional Equipment:   Intra-op Plan:   Post-operative Plan: Extubation in OR  Informed Consent: I have reviewed the patients History and Physical, chart, labs and discussed the procedure including the risks, benefits and alternatives for the proposed anesthesia with the patient or authorized representative who has indicated his/her understanding and acceptance.     Dental advisory given  Plan Discussed with: CRNA and Anesthesiologist  Anesthesia Plan Comments:        Anesthesia Quick Evaluation

## 2021-05-01 NOTE — Discharge Instructions (Addendum)
PT TOOK TORADOL AT 1:00. NO MOTRIN OR ANTI INFLAMMATORY MEDS UNTIL 7PM OR LATER      Ramond Marrow MD, MPH Alfonse Alpers, PA-C [facility] [address] [phone] (tel)   [phone] (fax)   POST-OPERATIVE INSTRUCTIONS - KNEE  WOUND CARE - You may remove the Operative Dressing on Post-Op Day #3 (72hrs after surgery).   - Alternatively if you would like you can leave dressing on until follow-up if within 7-8 days but keep it dry. - Leave steri-strips in place until they fall off on their own, usually 2 weeks postop. - An ACE wrap may be used to control swelling, do not wrap this too tight.  If the initial ACE wrap feels too tight you may loosen it. - There may be a small amount of fluid/bleeding leaking at the surgical site.  - This is normal; the knee is filled with fluid during the procedure and can leak for 24-48hrs after surgery.  - You may change/reinforce the bandage as needed.  - Use the Cryocuff or Ice as often as possible for the first 7 days, then as needed for pain relief. Always keep a towel, ACE wrap or other barrier between the cooling unit and your skin.  - You may shower on Post-Op Day #3. Gently pat the area dry. Do not soak the knee in water or submerge it.  - Do not go swimming in the pool or ocean until 4 weeks after surgery or when otherwise instructed.  Keep incisions as dry as possible.   BRACE/AMBULATION - You will be placed in a brace post-operatively.  - Wear your brace at all times until follow-up.  - You may remove for hygiene. -           Use crutches to help you ambulate -           Touch-down weight bearing: when you stand or walk, you may only touch your foot to the floor for balance -           Do NOT put any body weight on your leg  PHYSICAL THERAPY - You will begin physical therapy soon after surgery (unless otherwise specified) - Please call to set up an appointment, if you do not already have one  - Let our  office if there are any issues with scheduling your therapy  - A physical therapy referral was sent to [facility] on [address]  REGIONAL ANESTHESIA (NERVE BLOCKS) - The anesthesia team may have performed a nerve block for you if safe in the setting of your care.  This is a great tool used to minimize pain.  Typically the block may start wearing off overnight.  This can be a challenging period but please utilize your as needed pain medications to try and manage this period and know it will be a brief transition as the nerve block wears completely   POST-OP MEDICATIONS - Multimodal approach to pain control - In general your pain will be controlled with a combination of substances.  Prescriptions unless otherwise discussed are electronically sent to your pharmacy.  This is a carefully made plan we use to minimize narcotic use.     - Celebrex - Anti-inflammatory medication taken on a scheduled basis - Acetaminophen - Non-narcotic pain medicine taken on a scheduled basis  - Oxycodone - This is a strong narcotic, to be used only on an "as needed" basis for SEVERE pain. - Aspirin 81mg  -  This medicine is used to minimize the risk of blood clots after surgery. - Zofran - take as needed for nausea  FOLLOW-UP   Please call the office to schedule a follow-up appointment for your incision check, 7-10 days post-operatively.  IF YOU HAVE ANY QUESTIONS, PLEASE FEEL FREE TO CALL OUR OFFICE.   HELPFUL INFORMATION  - If you had a block, it will wear off between 8-24 hrs postop typically.  This is period when your pain may go from nearly zero to the pain you would have had post-op without the block.  This is an abrupt transition but nothing dangerous is happening.  You may take an extra dose of narcotic when this happens.   Keep your leg elevated to decrease swelling, which will then in turn decrease your pain. I would elevate the foot of your bed by putting a couple of couch pillows  between your mattress and box spring. I would not keep pillow directly under your ankle.  - Do not sleep with a pillow behind your knee even if it is more comfortable as this may make it harder to get your knee fully straight long term.   There will be MORE swelling on days 1-3 than there is on the day of surgery.  This also is normal. The swelling will decrease with the anti-inflammatory medication, ice and keeping it elevated. The swelling will make it more difficult to bend your knee. As the swelling goes down your motion will become easier   You may develop swelling and bruising that extends from your knee down to your calf and perhaps even to your foot over the next week. Do not be alarmed. This too is normal, and it is due to gravity   There may be some numbness adjacent to the incision site. This may last for 6-12 months or longer in some patients and is expected.   You may return to sedentary work/school in the next couple of days when you feel up to it. You will need to keep your leg elevated as much as possible    You should wean off your narcotic medicines as soon as you are able.  Most patients will be off or using minimal narcotics before their first postop appointment.    We suggest you use the pain medication the first night prior to going to bed, in order to ease any pain when the anesthesia wears off. You should avoid taking pain medications on an empty stomach as it will make you nauseous.   Do not drink alcoholic beverages or take illicit drugs when taking pain medications.   It is against the law to drive while taking narcotics. You cannot drive if your Right leg is in brace locked in extension.   Pain medication may make you constipated.  Below are a few solutions to try in this order:  o Decrease the amount of pain medication if you aren't having pain.  o Drink lots of decaffeinated fluids.  o Drink prune juice and/or each dried prunes   o If the first 3 don't  work start with additional solutions  o Take Colace - an over-the-counter stool softener  o Take Senokot - an over-the-counter laxative  o Take Miralax - a stronger over-the-counter laxative   For more information including helpful videos and documents visit our website:   [email or web address]       Post Anesthesia Home Care Instructions  Activity: Get plenty of rest for the remainder of the day. A responsible  individual must stay with you for 24 hours following the procedure.  For the next 24 hours, DO NOT: -Drive a car -Advertising copywriter -Drink alcoholic beverages -Take any medication unless instructed by your physician -Make any legal decisions or sign important papers.  Meals: Start with liquid foods such as gelatin or soup. Progress to regular foods as tolerated. Avoid greasy, spicy, heavy foods. If nausea and/or vomiting occur, drink only clear liquids until the nausea and/or vomiting subsides. Call your physician if vomiting continues.  Special Instructions/Symptoms: Your throat may feel dry or sore from the anesthesia or the breathing tube placed in your throat during surgery. If this causes discomfort, gargle with warm salt water. The discomfort should disappear within 24 hours.  If you had a scopolamine patch placed behind your ear for the management of post- operative nausea and/or vomiting:  1. The medication in the patch is effective for 72 hours, after which it should be removed.  Wrap patch in a tissue and discard in the trash. Wash hands thoroughly with soap and water. 2. You may remove the patch earlier than 72 hours if you experience unpleasant side effects which may include dry mouth, dizziness or visual disturbances. 3. Avoid touching the patch. Wash your hands with soap and water after contact with the patch.    Call your surgeon if you experience:   1.  Fever over 101.0. 2.  Inability to urinate. 3.  Nausea and/or  vomiting. 4.  Extreme swelling or bruising at the surgical site. 5.  Continued bleeding from the incision. 6.  Increased pain, redness or drainage from the incision. 7.  Problems related to your pain medication. 8.  Any problems and/or concerns

## 2021-05-01 NOTE — Op Note (Signed)
Orthopaedic Surgery Operative Note (CSN: [id number])  Jill Mcdaniel  1976-03-29 Date of Surgery: 05/01/2021   Diagnoses:  left knee PCL, MCL tear  Procedure: Left knee PCL reconstruction with allograft Left MCL repair and internal brace with suture tape Left knee arthroscopic chondroplasty   Operative Finding Exam under anesthesia: Patient had a grade 2-2.5 PCL with no obvious endpoint.  She with valgus stress, grade 2.  Full motion.  Good endpoint with Lachman and lateral side.  Normal. Suprapatellar pouch: Normal Patellofemoral Compartment: Grade 3 and some scattered grade 4 central and lateral patellar chondral changes debrided back. Medial Compartment: Normal Lateral Compartment: Normal Intercondylar Notch: Clear laxity in the ACL consistent with a posterior subluxation PCL injury.  Successful completion of the planned procedure.  Patient's reconstruction was robust with the PCL.  We had good fixation and backed up her fixation on the tibia with a swivel lock.  We repaired the MCL and had reasonable tissue quality.  Internal brace helped augment this.  Good stability in the case.  Post-operative plan: The patient will be touchdown weightbearing for 6 weeks following multi ligamentous protocol.  The patient will be discharged home.  DVT prophylaxis Aspirin 81 mg twice daily for 6 weeks.  Pain control with PRN pain medication preferring oral medicines.  Follow up plan will be scheduled in approximately 7 days for incision check and XR.  Post-Op Diagnosis: Same Surgeons:Primary: Hiram Gash, MD Assistants:Caroline McBane PA-C Location: [place] OR ROOM 6 Anesthesia: General with local anesthetic Antibiotics: Ancef 2 g with local vancomycin powder 1 g at the surgical site Tourniquet time:  Total Tourniquet Time Documented: Thigh (Left) - 98 minutes Total: Thigh (Left) - 98 minutes  Estimated Blood Loss: Minimal Complications: None Specimens: None Implants: Implant Name Type  Inv. Item Serial No. Manufacturer Lot No. LRB No. Used Action  TISSUE GRAFTLINK 65-95MML - [id number] Tissue TISSUE GRAFTLINK 65-95MML [id number] [facility] [id number] Left 1 Implanted  PK GRAFTLINK ALLO IMPLANT SYST - [id number] Anchor PK GRAFTLINK ALLO IMPLANT SYST  [employer] [id number] Left 1 Implanted  SYS INTERNAL BRACE KNEE - [id number] Miscellaneous SYS INTERNAL BRACE KNEE  [employer] [id number] Left 1 Implanted  Bruce Donath TIGHTROPE RN [id number] Anchor ANCHOR BUTTON TIGHTROPE RN 14  [place] [id number] Left 1 Implanted  ANCHOR PEEK 4.75X19.1 Gertha Calkin [id number] Anchor ANCHOR PEEK 4.75X19.1 [employer] [id number] Left 1 Implanted    Indications for Surgery:   Jill Mcdaniel is a 45 y.o. female with injury that resulted in a unstable knee.  MRI demonstrated a PCL and MCL injury though the MCL was avulsed from the femur.  Benefits and risks of operative and nonoperative management were discussed prior to surgery with patient/guardian(s) and informed consent form was completed.  Specific risks including infection, need for additional surgery, continued instability, stiffness, need for reconstruction amongst others   Procedure:   The patient was identified properly. Informed consent was obtained and the surgical site was marked. The patient was taken up to suite where general anesthesia was induced. The patient was placed in the supine position with a post against the surgical leg and a nonsterile tourniquet applied. The surgical leg was then prepped and draped usual sterile fashion.  A standard surgical timeout was performed.  2 standard anterior portals were made and diagnostic arthroscopy performed. Please note the findings as noted above.  We performed a gentle chondroplasty of the patellofemoral joint using a shaver back to a stable base.  We then turned our attention to the [facility] repair and internal bracing.  A 3 cm incision was made over the medial femoral condyle.   With the skin sharp achieve hemostasis we progressed.  We protect neurovascular structures and bluntly dissected down to the layers of the MCL.  We identified the zone of injury and noted that there was reasonable tissue quality.  We mobilized the tissue and placed a locking running Krakw suture in the form of a #2 FiberWire.  We had good robust tension on the tissue.  We felt that repair and internal bracing would be appropriate.  We used a internal brace kit to place 2 K wires to finer points of isometry and noted that we had only 1-2's millimeters of change in length and were very happy with our symmetry points.  We placed the knee in about 30 degrees of flexion and reamed a 4.5 mm tunnel over the wire at the femoral origin of the MCL.  We then loaded our #2 FiberWire's and a 4.75 bio composite swivel lock and performed a knotless repair of the MCL.  We then used the safety stitches from the swivel lock and performed another running locking Krakw stitch which was tied to imbricate the MCL further.  We had good footprint.  The knee was stable at this point but we felt that internal bracing was appropriate.  We waited till the end of the case to place her internal brace to additionally backup her PCL reconstruction.  PCL Reconstruction: At this point we began to identify the stump off the femur of the PCL.  This was grossly loose.  We began by sharply excising stump that was residual of the PCL taking care to not injure the ACL.  This point we were able to see to the back of the knee by placing the scope medial to the ACL fibers.  We then under direct visualization using spinal needle to localize and carefully place a posterior medial portal avoiding the posterior neurovascular structures.  We opened the capsule bluntly before placing a hard body cannula.  This point we checked our excursion and make sure that we were able to reach posterior lateral and distal on the tibia.  We then proceeded to use a 70  degree scope in combination with shaver and a radiofrequency ablator to clear the posterior stump of the PCL from its origin on the tibia.  At this point we checked with fluoroscopy to ensure that we were low enough just above the posterior flare of the tibia.    We then marked and cleared the central portion of the tibial insertion on the femur identifying a point just off the articular margin with enough room to allow a 10 mm reamer to avoid damage to the cartilage.  At this point we used the Arthrex PCL guide set with a flip cutter to first place a 2.4 mm guide pin, check her position on fluoroscopy and switch this for a 10 barrel reamer reaming out the anterior tibia.  We obtained good visualization of the tip of the instrument and sure that it never passed into the capsule posteriorly.   We then used a retro-reamer set guide to identify the femoral footprint of the PCL that we had marked and reamed a 20 mm tunnel into the femur again with a flip cutter taking care to avoid reaming out the medial cortex of the femur.  The graft was an allograft graft link Arthrex graft size 10with buttons attached  to both ends.  This point passing sutures were placed through both of these tunnels after bony debris was cleared and her sutures were correctly shuttled to avoid a tissue bridge.  At this point we placed  And were able to pass it without issue.  We brought it into the tibial tunnel and then shuttled it into the femoral tunnel placing her button under direct visualization.  Were happy with our buttons position and the tension on the construct on the femur.  We then pulled and used the 14 mm button from Arthrex to obtain cortical fit and placed a gentle anterior drawer while tensioning the graft.  We had a restoration of the standard kinematics of the knee with an appropriate position of the tibia relative the femur.  We tied our blue sutures from the graft over the button and additionally took the sutures  and loaded them into the 4.75 peek swivel lock that we placed in the tibia which we also used to internally brace the MCL.  We placed these both taking care not to over tension the internal brace and we had the knee in about 40 degrees of flexion and a varus position.  We had good tension but not over tension of the internal brace.  Final examination of the knee demonstrated a stable knee without opening to a valgus stress and the PCL appeared appropriate.   Incisions closed with absorbable suture. The patient was awoken from general anesthesia and taken to the PACU in stable condition without complication.   Noemi Chapel, PA-C, present and scrubbed throughout the case, critical for completion in a timely fashion, and for retraction, instrumentation, closure.

## 2021-05-01 NOTE — Interval H&P Note (Signed)
All questions answered, patient wants to proceed with procedure. ? ?

## 2021-05-01 NOTE — Anesthesia Procedure Notes (Signed)
Procedure Name: LMA Insertion Date/Time: 05/01/2021 10:48 AM Performed by: Thornell Mule, CRNA Pre-anesthesia Checklist: Patient identified, Emergency Drugs available, Suction available and Patient being monitored Patient Re-evaluated:Patient Re-evaluated prior to induction Oxygen Delivery Method: Circle system utilized Preoxygenation: Pre-oxygenation with 100% oxygen Induction Type: IV induction LMA: LMA inserted LMA Size: 4.0 Number of attempts: 1 Placement Confirmation: positive ETCO2 and breath sounds checked- equal and bilateral Tube secured with: Tape Dental Injury: Teeth and Oropharynx as per pre-operative assessment

## 2021-05-01 NOTE — Anesthesia Postprocedure Evaluation (Signed)
Anesthesia Post Note  Patient: CATERA HANKINS  Procedure(s) Performed: KNEE ARTHROSCOPY WITH POSTERIOR CRUCIATE LIGAMENT (PCL) RECONSTRUCTION (Left: Knee) KNEE ARTHROSCOPY WITH MEDIAL COLLATERAL LIGAMENT REPAIR (Left: Knee)     Patient location during evaluation: PACU Anesthesia Type: General Level of consciousness: awake and alert and oriented Pain management: pain level controlled Vital Signs Assessment: post-procedure vital signs reviewed and stable Respiratory status: spontaneous breathing, nonlabored ventilation and respiratory function stable Cardiovascular status: blood pressure returned to baseline and stable Postop Assessment: no apparent nausea or vomiting Anesthetic complications: no   No notable events documented.  Last Vitals:  Vitals:   05/01/21 1340 05/01/21 1349  BP:    Pulse: 80 94  Resp: 11 18  Temp:    SpO2: 100% 96%    Last Pain:  Vitals:   05/01/21 1349  TempSrc:   PainSc: 7                  Herbert Aguinaldo A.

## 2021-05-01 NOTE — Transfer of Care (Signed)
Immediate Anesthesia Transfer of Care Note  Patient: Jill Mcdaniel  Procedure(s) Performed: KNEE ARTHROSCOPY WITH POSTERIOR CRUCIATE LIGAMENT (PCL) RECONSTRUCTION (Left: Knee) KNEE ARTHROSCOPY WITH MEDIAL COLLATERAL LIGAMENT REPAIR (Left: Knee)  Patient Location: PACU  Anesthesia Type:General  Level of Consciousness: drowsy, patient cooperative and responds to stimulation  Airway & Oxygen Therapy: Patient Spontanous Breathing and Patient connected to face mask oxygen  Post-op Assessment: Report given to RN and Post -op Vital signs reviewed and stable  Post vital signs: Reviewed and stable  Last Vitals:  Vitals Value Taken Time  BP    Temp    Pulse    Resp    SpO2      Last Pain:  Vitals:   05/01/21 0918  TempSrc: Oral  PainSc: 0-No pain      Patients Stated Pain Goal: 5 (05/01/21 0918)  Complications: No notable events documented.

## 2021-05-03 ENCOUNTER — Encounter (HOSPITAL_BASED_OUTPATIENT_CLINIC_OR_DEPARTMENT_OTHER): Payer: Self-pay | Admitting: Orthopaedic Surgery

## 2021-05-07 NOTE — Therapy (Addendum)
OUTPATIENT PHYSICAL THERAPY LOWER EXTREMITY EVALUATION   Patient Name: Jill Mcdaniel MRN: [id number] DOB:November 06, 1975, 45 y.o., female Today's Date: 05/08/2021   PT End of Session - 05/08/21 1350     Visit Number 1    Number of Visits 24    Date for PT Re-Evaluation 07/31/21    Authorization Type [employer] MCD    Authorization - Number of Visits 27    PT Start Time 1047    PT Stop Time 1127    PT Time Calculation (min) 40 min             Past Medical History:  Diagnosis Date   Anemia    Blood transfusion without reported diagnosis 1997   anemia   Seropositive for herpes simplex 2 infection    Vaginal Pap smear, abnormal    Past Surgical History:  Procedure Laterality Date   Abortions     CERVICAL CONIZATION W/BX N/A 03/22/2021   Procedure: COLD KNIFE CONIZATION CERVIX WITH BIOPSY;  Surgeon: Steva Ready, DO;  Location: [facility];  Service: Gynecology;  Laterality: N/A;   CESAREAN SECTION     CESAREAN SECTION WITH BILATERAL TUBAL LIGATION Bilateral 04/07/2014   Procedure: CESAREAN SECTION WITH BILATERAL TUBAL LIGATION;  Surgeon: Dorien Chihuahua. Richardson Dopp, MD;  Location: [facility];  Service: Obstetrics;  Laterality: Bilateral;   FRACTURE SURGERY     KNEE ARTHROSCOPY WITH MEDIAL COLLATERAL LIGAMENT RECONSTRUCTION Left 05/01/2021   Procedure: KNEE ARTHROSCOPY WITH MEDIAL COLLATERAL LIGAMENT REPAIR;  Surgeon: Bjorn Pippin, MD;  Location: [facility];  Service: Orthopedics;  Laterality: Left;   KNEE ARTHROSCOPY WITH POSTERIOR CRUCIATE LIGAMENT (PCL) RECONSTRUCTION Left 05/01/2021   Procedure: KNEE ARTHROSCOPY WITH POSTERIOR CRUCIATE LIGAMENT (PCL) RECONSTRUCTION;  Surgeon: Bjorn Pippin, MD;  Location: [facility];  Service: Orthopedics;  Laterality: Left;   Patient Active Problem List   Diagnosis Date Noted   S/P cesarean section 04/07/2014    PCP: Pcp, No  REFERRING PROVIDER: Bjorn Pippin, MD  REFERRING DIAG: Left Knee Arthroscopy with  PCL reconstruction and MCL repair on 05/01/2021  THERAPY DIAG:  Left knee pain, unspecified chronicity  Local edema  Muscle weakness  Balance problem  Other abnormalities of gait and mobility  ONSET DATE: 12/07  SUBJECTIVE:                                                                                                                                                                                           Jill Mcdaniel is a 45 y.o. female who presents to clinic with chief complaint of L swelling and discomfort following PCL and  MCL repair on 05/01/21.  Left knee PCL reconstruction with allograft Left MCL repair and internal brace with suture tape Left knee arthroscopic chondroplasty  MOI/History of condition:   Injury occurred while dancing  Indications for Surgery:         Jill Mcdaniel is a 45 y.o. female with injury that resulted in a unstable knee.  MRI demonstrated a PCL and MCL injury though the MCL was avulsed from the femur.  Benefits and risks of operative and nonoperative management were discussed prior to surgery with patient/guardian(s) and informed consent form was completed.   Red flags:  denies malaise, erythema / streaking, and chills / night sweats  Pertinent past history:  None  Pain:  Are you having pain? No  Occupation: No  Hobbies/Recreation: Copy: has bil axillary crutches  Patient Goals not limp, get back to normal   PRECAUTIONS: None  WEIGHT BEARING RESTRICTIONS WEIGHT BEARING RESTRICTIONS Yes TDWB with knee locked in ext at all times for 6 weeks (no resisted knee flexion or hyper ext for 6 months); follow multiligamentous protocol   FALLS:  Has patient fallen in last 6 months? No, Number of falls: no  LIVING ENVIRONMENT: Lives with: lives with their family Stairs: Yes; 3 stairs to enter   PLOF: Independent   OBJECTIVE:     COGNITION:  Overall cognitive status: Within functional limits for tasks  assessed     SENSATION:  Light touch: Appears intact  LE AROM/PROM:  A/PROM Right 05/08/2021 Left 05/08/2021  Hip flexion    Hip extension    Hip abduction    Hip adduction    Hip internal rotation    Hip external rotation    Knee flexion 110 55  Knee extension WNL Lacking 7 degrees  Ankle dorsiflexion    Ankle plantarflexion    Ankle inversion    Ankle eversion     (Blank rows = not tested)  LE MMT:  MMT Right 05/08/2021 Left 05/08/2021  Hip flexion    Knee extension    Knee flexion    Hip abduction    Hip extension    Hip external rotation    Hip internal rotation    Hip adduction    Ankle dorsiflexion    Ankle plantarflexion    Ankle inversion    Ankle eversion     (Blank rows = not tested; all scores listed out of a possible 5)   PATIENT EDUCATION:  POC, diagnosis, prognosis, HEP, precautions, and outcome measures.  Pt educated via explanation, demonstration, and handout (HEP).  Pt confirms understanding verbally.    HOME EXERCISE PROGRAM: Access Code: [email or web address] URL: [email or web address] Date: 05/08/2021 Prepared by: Alphonzo Severance  Exercises Supine Ankle Pumps - 8 x daily - 7 x weekly - 2 sets - 10 reps Sitting Heel Slide with Towel - 1 x daily - 7 x weekly - 3 sets - 10 reps Ice - 5 x daily - 7 x weekly - 1 sets - 1 reps - 20 min hold Supine Quad Set - 3 x daily - 7 x weekly - 2 sets - 10 reps - 10 hold Long Sitting 4 Way Patellar Glide - 1 x daily - 7 x weekly - 3 sets - 10 reps   ASSESSMENT:  CLINICAL IMPRESSION: Jill Mcdaniel is a 45 y.o. female who presents to clinic with signs and sxs consistent with post op swelling and discomfort following L MCL and PCL reconstruction on 12/07.  Patient presents with impairments/deficits in:  L knee ROM, L knee strength, balance, gait.  Activity limitations include: transfers, step navigation, bending, squatting, jogging.  Participation limitations include: dancing, lifting objects around the  house.  Patient will benefit from skilled therapy to address pain and the listed deficits in order to achieve functional goals, enable safety and independence in completion of daily tasks, and return to PLOF.   REHAB POTENTIAL: Good  CLINICAL DECISION MAKING: Stable/uncomplicated  EVALUATION COMPLEXITY: Low   GOALS: Goals reviewed with patient? Yes  SHORT TERM GOALS:  STG Name Target Date Goal status  1 Jill Mcdaniel will be >75% HEP compliant to improve carryover between sessions and facilitate independent management of condition  Baseline: No HEP 05/29/2021 INITIAL   LONG TERM GOALS:    LTG Name Target Date Goal status  1 LEFS 07/31/21 INITIAL  2 Jill Mcdaniel will achieve 3 degrees knee extension by D/C (see POC end date) to improve stability in stance phase of gait    Baseline: lacking 7 degrees 07/31/21 INITIAL  3 Jill Mcdaniel will achieve 110 degrees knee flexion by D/C (see POC end date) to improve ability to safely navigate steps in the community   Baseline: 55 degrees 07/31/21 INITIAL  4 Jill Mcdaniel will be able to stand for >30'' in SLS stance, to show a significant improvement in balance in order to reduce fall risk    Baseline: unable 07/31/21 INITIAL  5 Jill Mcdaniel will improve the following MMTs to >/= 4+/5 to show improvement in strength:  knee ext, hip abd, hip ext    Baseline: unable to test d/t precautions 07/31/21 INITIAL  6 Gait speed 07/31/21 INITIAL   PLAN: PT FREQUENCY: 1-2x/week  PT DURATION: 12 weeks (Ending 07/31/21)  PLANNED INTERVENTIONS: Therapeutic exercises, Therapeutic activity, Neuro Muscular re-education, Gait training, Patient/Family education, Joint mobilization, Dry Needling, Electrical stimulation, Spinal mobilization and/or manipulation, Moist heat, Taping, Vasopneumatic device, Ionotophoresis 4mg /ml Dexamethasone, and Manual therapy  PLAN FOR NEXT SESSION: progress per protocol, take gait and LEFS for goals   PT, DPT 05/08/2021, 1:52 PM

## 2021-05-08 ENCOUNTER — Other Ambulatory Visit: Payer: Self-pay

## 2021-05-08 ENCOUNTER — Ambulatory Visit: Payer: Medicaid Other | Attending: Orthopaedic Surgery | Admitting: Physical Therapy

## 2021-05-08 DIAGNOSIS — R600 Localized edema: Secondary | ICD-10-CM | POA: Insufficient documentation

## 2021-05-08 DIAGNOSIS — M25562 Pain in left knee: Secondary | ICD-10-CM | POA: Diagnosis not present

## 2021-05-08 DIAGNOSIS — R2689 Other abnormalities of gait and mobility: Secondary | ICD-10-CM | POA: Insufficient documentation

## 2021-05-08 DIAGNOSIS — M6281 Muscle weakness (generalized): Secondary | ICD-10-CM | POA: Diagnosis present

## 2021-05-08 NOTE — Addendum Note (Signed)
Addended by: Fredderick Phenix on: 05/08/2021 02:03 PM   Modules accepted: Orders

## 2021-05-11 NOTE — Therapy (Deleted)
OUTPATIENT PHYSICAL THERAPY TREATMENT NOTE   Patient Name: Jill Mcdaniel MRN: [id number] DOB:Jun 18, 1975, 45 y.o., female Today's Date: 05/11/2021  PCP: Pcp, No REFERRING PROVIDER: No ref. provider found    Past Medical History:  Diagnosis Date   Anemia    Blood transfusion without reported diagnosis 1997   anemia   Seropositive for herpes simplex 2 infection    Vaginal Pap smear, abnormal    Past Surgical History:  Procedure Laterality Date   Abortions     CERVICAL CONIZATION W/BX N/A 03/22/2021   Procedure: COLD KNIFE CONIZATION CERVIX WITH BIOPSY;  Surgeon: Steva Ready, DO;  Location: [facility];  Service: Gynecology;  Laterality: N/A;   CESAREAN SECTION     CESAREAN SECTION WITH BILATERAL TUBAL LIGATION Bilateral 04/07/2014   Procedure: CESAREAN SECTION WITH BILATERAL TUBAL LIGATION;  Surgeon: Dorien Chihuahua. Richardson Dopp, MD;  Location: [facility];  Service: Obstetrics;  Laterality: Bilateral;   FRACTURE SURGERY     KNEE ARTHROSCOPY WITH MEDIAL COLLATERAL LIGAMENT RECONSTRUCTION Left 05/01/2021   Procedure: KNEE ARTHROSCOPY WITH MEDIAL COLLATERAL LIGAMENT REPAIR;  Surgeon: Bjorn Pippin, MD;  Location: [facility];  Service: Orthopedics;  Laterality: Left;   KNEE ARTHROSCOPY WITH POSTERIOR CRUCIATE LIGAMENT (PCL) RECONSTRUCTION Left 05/01/2021   Procedure: KNEE ARTHROSCOPY WITH POSTERIOR CRUCIATE LIGAMENT (PCL) RECONSTRUCTION;  Surgeon: Bjorn Pippin, MD;  Location: [facility];  Service: Orthopedics;  Laterality: Left;   Patient Active Problem List   Diagnosis Date Noted   S/P cesarean section 04/07/2014    REFERRING DIAG: REFERRING DIAG: Left Knee Arthroscopy with PCL reconstruction and MCL repair on 05/01/2021  THERAPY DIAG:  No diagnosis found.  PERTINENT HISTORY: NA  PRECAUTIONS:   PRECAUTIONS:   Left knee PCL reconstruction with allograft Left MCL repair and internal brace with suture tape Left knee arthroscopic  chondroplasty   WEIGHT BEARING RESTRICTIONS WEIGHT BEARING RESTRICTIONS Yes TDWB with knee locked in ext at all times for 6 weeks (no resisted knee flexion or hyper ext for 6 months); follow multiligamentous protocol   SUBJECTIVE: ***  PAIN:  Are you having pain? {yes/no:20286} VAS scale: ***/10 Pain location: *** Pain orientation: {Pain Orientation:25161}  PAIN TYPE: {type:313116} Pain description: {PAIN DESCRIPTION:21022940}  Aggravating factors: *** Relieving factors: ***   Objective:  LE AROM/PROM:   A/PROM Right 05/08/2021 Left 05/08/2021  Hip flexion      Hip extension      Hip abduction      Hip adduction      Hip internal rotation      Hip external rotation      Knee flexion 110 55  Knee extension WNL Lacking 7 degrees  Ankle dorsiflexion      Ankle plantarflexion      Ankle inversion      Ankle eversion       (Blank rows = not tested)   LE MMT:   MMT Right 05/08/2021 Left 05/08/2021  Hip flexion      Knee extension      Knee flexion      Hip abduction      Hip extension      Hip external rotation      Hip internal rotation      Hip adduction      Ankle dorsiflexion      Ankle plantarflexion      Ankle inversion      Ankle eversion       (Blank rows = not tested; all scores listed  out of a possible 5)  TODAY'S TREATMENT :  Therapeutic Exercise: - ***  Manual Therapy: - ***  Neuromuscular re-ed: - ***  Therapeutic Activity: - ***  Self-care/Home Management: - ***   HOME EXERCISE PROGRAM: Access Code: [email or web address] URL: [email or web address] Date: 05/08/2021 Prepared by: Alphonzo Severance   Exercises Supine Ankle Pumps - 8 x daily - 7 x weekly - 2 sets - 10 reps Sitting Heel Slide with Towel - 1 x daily - 7 x weekly - 3 sets - 10 reps Ice - 5 x daily - 7 x weekly - 1 sets - 1 reps - 20 min hold Supine Quad Set - 3 x daily - 7 x weekly - 2 sets - 10 reps - 10 hold Long Sitting 4 Way Patellar Glide - 1 x daily - 7 x  weekly - 3 sets - 10 reps     ASSESSMENT:   Jill Mcdaniel is progressing {kerslprogress:25730} with therapy.  Pt reports {kerslreaction:25732}.  Today we concentrated on {kerslconcentration:25731}.  Pt ***.  Pt will continue to benefit from skilled physical therapy to address remaining deficits and achieve listed goals.  Continue per POC.      GOALS:   SHORT TERM GOALS:   STG Name Target Date Goal status  1 Jill Mcdaniel will be >75% HEP compliant to improve carryover between sessions and facilitate independent management of condition   Baseline: No HEP 05/29/2021 INITIAL    LONG TERM GOALS:    LTG Name Target Date Goal status  1 LEFS 07/31/21 INITIAL  2 Jill Fus will achieve 3 degrees knee extension by D/C (see POC end date) to improve stability in stance phase of gait    Baseline: lacking 7 degrees 07/31/21 INITIAL  3 Jill Fus will achieve 110 degrees knee flexion by D/C (see POC end date) to improve ability to safely navigate steps in the community   Baseline: 55 degrees 07/31/21 INITIAL  4 Jill Fus will be able to stand for >30'' in SLS stance, to show a significant improvement in balance in order to reduce fall risk    Baseline: unable 07/31/21 INITIAL  5 Jill Fus will improve the following MMTs to >/= 4+/5 to show improvement in strength:  knee ext, hip abd, hip ext    Baseline: unable to test d/t precautions 07/31/21 INITIAL  6 Gait speed 07/31/21 INITIAL    PLAN: PT FREQUENCY: 1-2x/week   PT DURATION: 12 weeks (Ending 07/31/21)   PLANNED INTERVENTIONS: Therapeutic exercises, Therapeutic activity, Neuro Muscular re-education, Gait training, Patient/Family education, Joint mobilization, Dry Needling, Electrical stimulation, Spinal mobilization and/or manipulation, Moist heat, Taping, Vasopneumatic device, Ionotophoresis 4mg /ml Dexamethasone, and Manual therapy   PLAN FOR NEXT SESSION: progress per protocol, take gait and LEFS for goals    05/11/2021, 10:53 AM

## 2021-05-15 ENCOUNTER — Ambulatory Visit: Payer: Medicaid Other | Admitting: Physical Therapy

## 2021-05-17 ENCOUNTER — Ambulatory Visit: Payer: Medicaid Other | Admitting: Physical Therapy

## 2021-05-21 ENCOUNTER — Other Ambulatory Visit: Payer: Self-pay

## 2021-05-21 ENCOUNTER — Encounter: Payer: Self-pay | Admitting: Rehabilitative and Restorative Service Providers"

## 2021-05-21 ENCOUNTER — Ambulatory Visit: Payer: Medicaid Other | Admitting: Rehabilitative and Restorative Service Providers"

## 2021-05-21 DIAGNOSIS — R2689 Other abnormalities of gait and mobility: Secondary | ICD-10-CM

## 2021-05-21 DIAGNOSIS — R600 Localized edema: Secondary | ICD-10-CM

## 2021-05-21 DIAGNOSIS — M25562 Pain in left knee: Secondary | ICD-10-CM | POA: Diagnosis not present

## 2021-05-21 DIAGNOSIS — M6281 Muscle weakness (generalized): Secondary | ICD-10-CM

## 2021-05-21 NOTE — Therapy (Signed)
OUTPATIENT PHYSICAL THERAPY TREATMENT NOTE   Patient Name: Jill Mcdaniel MRN: [id number] DOB:[birth year], 45 y.o., female Today's Date: 05/21/2021  PCP: Oneita Hurt, No REFERRING PROVIDER: Bjorn Pippin, MD   PT End of Session - 05/21/21 1313     Visit Number 2    Number of Visits 24    Date for PT Re-Evaluation 07/31/21    Authorization Type [employer] MCD    Authorization - Number of Visits 27    PT Start Time 1137    PT Stop Time 1220    PT Time Calculation (min) 43 min    Equipment Utilized During Treatment Left knee immobilizer    Activity Tolerance Patient tolerated treatment well;No increased pain    Behavior During Therapy WFL for tasks assessed/performed             Past Medical History:  Diagnosis Date   Anemia    Blood transfusion without reported diagnosis 1997   anemia   Seropositive for herpes simplex 2 infection    Vaginal Pap smear, abnormal    Past Surgical History:  Procedure Laterality Date   Abortions     CERVICAL CONIZATION W/BX N/A 03/22/2021   Procedure: COLD KNIFE CONIZATION CERVIX WITH BIOPSY;  Surgeon: Steva Ready, DO;  Location: [facility];  Service: Gynecology;  Laterality: N/A;   CESAREAN SECTION     CESAREAN SECTION WITH BILATERAL TUBAL LIGATION Bilateral 04/07/2014   Procedure: CESAREAN SECTION WITH BILATERAL TUBAL LIGATION;  Surgeon: Dorien Chihuahua. Richardson Dopp, MD;  Location: [facility];  Service: Obstetrics;  Laterality: Bilateral;   FRACTURE SURGERY     KNEE ARTHROSCOPY WITH MEDIAL COLLATERAL LIGAMENT RECONSTRUCTION Left 05/01/2021   Procedure: KNEE ARTHROSCOPY WITH MEDIAL COLLATERAL LIGAMENT REPAIR;  Surgeon: Bjorn Pippin, MD;  Location: [facility];  Service: Orthopedics;  Laterality: Left;   KNEE ARTHROSCOPY WITH POSTERIOR CRUCIATE LIGAMENT (PCL) RECONSTRUCTION Left 05/01/2021   Procedure: KNEE ARTHROSCOPY WITH POSTERIOR CRUCIATE LIGAMENT (PCL) RECONSTRUCTION;  Surgeon: Bjorn Pippin, MD;  Location: [facility];   Service: Orthopedics;  Laterality: Left;   Patient Active Problem List   Diagnosis Date Noted   S/P cesarean section 04/07/2014   REFERRING PROVIDER: Bjorn Pippin, MD   REFERRING DIAG: Left Knee Arthroscopy with PCL reconstruction and MCL repair on 05/01/2021   THERAPY DIAG:  Left knee pain, unspecified chronicity   Local edema   Muscle weakness   Balance problem   Other abnormalities of gait and mobility   ONSET DATE: 12/07  THERAPY DIAG:  Left knee pain, unspecified chronicity  Local edema  Muscle weakness  Balance problem  Other abnormalities of gait and mobility  PERTINENT HISTORY: None   WEIGHT BEARING RESTRICTIONS WEIGHT BEARING RESTRICTIONS Yes TDWB with knee locked in ext at all times for 6 weeks (no resisted knee flexion or hyper ext for 6 months); follow multiligamentous protocol   SUBJECTIVE: No pain but me and those crutches don't work. I fell but broke my fall. Pt arrived to PT [facility] without crutches with L knee immobilizer donned.  PAIN:  Are you having pain? No VAS scale: 0/10 Pain location: L knee    OBJECTIVE:        COGNITION:          Overall cognitive status: Within functional limits for tasks assessed                        SENSATION:  Light touch: Appears intact   LE AROM/PROM:   A/PROM Right 05/08/2021 Left 05/08/2021  Hip flexion      Hip extension      Hip abduction      Hip adduction      Hip internal rotation      Hip external rotation      Knee flexion 110 55  Knee extension WNL Lacking 7 degrees  Ankle dorsiflexion      Ankle plantarflexion      Ankle inversion      Ankle eversion       (Blank rows = not tested)   LE MMT:   MMT Right 05/08/2021 Left 05/08/2021  Hip flexion      Knee extension      Knee flexion      Hip abduction      Hip extension      Hip external rotation      Hip internal rotation      Hip adduction      Ankle dorsiflexion      Ankle plantarflexion      Ankle inversion       Ankle eversion       (Blank rows = not tested; all scores listed out of a possible 5)     PATIENT EDUCATION:  POC, diagnosis, prognosis, HEP, precautions, and outcome measures.  Pt educated via explanation, demonstration, and handout (HEP).  Pt confirms understanding verbally.    [facility] Adult PT Treatment:      DATE: 05/21/21 Therapeutic Exercise: Quad sets x 15 with PT max verbal cues for technique, glute sets x 20, sidelying hip abdct x 15, sidelying hip addct x 15, red theraband ankle DF x 15, ankle pump x 20 with concentration on calf tightening, ankle alphabet with concentration on full ankle motion, glute set with small glute lift x 20. Knee immobilizer used for all exercises Manual Therapy: L patella mobs all directions with PT educating pt on proper technique (also documented under Theract)  Theract: Discussed patella mobs and reviewed technique L LE, discussed TDWB due to pt having no crutches when walking into therapy, advised pt to call MD regarding RW vs crutches vs her preference to discontinue usage of crutches, PT reviewed MD parameters and advisement on TDWB  and immobilizer being locked in ext at all times x 6 weeks, reviewed protocol with pt. PT voiced concerns over immobilizer sliding with PT assessing and immobilizer being improperly placed on leg. PT reviewed where immobilizer is supposed to be worn on leg.    HOME EXERCISE PROGRAM: Access Code: [email or web address] URL: [email or web address] Date: 05/08/2021 Prepared by: Alphonzo Severance   Exercises Supine Ankle Pumps - 8 x daily - 7 x weekly - 2 sets - 10 reps Sitting Heel Slide with Towel - 1 x daily - 7 x weekly - 3 sets - 10 reps  Ice - 5 x daily - 7 x weekly - 1 sets - 1 reps - 20 min hold Supine Quad Set - 3 x daily - 7 x weekly - 2 sets - 10 reps - 10 hold Long Sitting 4 Way Patellar Glide - 1 x daily - 7 x weekly - 3 sets - 10 reps     ASSESSMENT:   CLINICAL IMPRESSION: Jill Mcdaniel is a 45 y.o. female  who presents to clinic with signs and sxs consistent with post op swelling and discomfort following L MCL and PCL reconstruction on 12/07.  Patient will benefit from skilled therapy to address pain  and the listed deficits in order to achieve functional goals, enable safety and independence in completion of daily tasks, and return to PLOF.Therapy focused on following 2-6 week protocol for L LE strengthening. Brace donned for all. Pt without pain; unable to tell when she is performing patella mobs correctly with PT reviewing technique.      REHAB POTENTIAL: Good   CLINICAL DECISION MAKING: Stable/uncomplicated   EVALUATION COMPLEXITY: Low     GOALS: Goals reviewed with patient? Yes   SHORT TERM GOALS:   STG Name Target Date Goal status  1 Jill Mcdaniel will be >75% HEP compliant to improve carryover between sessions and facilitate independent management of condition   Baseline: No HEP 05/29/2021 INITIAL    LONG TERM GOALS:    LTG Name Target Date Goal status  1 LEFS 07/31/21 INITIAL  2 Jill Mcdaniel Fus will achieve 3 degrees knee extension by D/C (see POC end date) to improve stability in stance phase of gait    Baseline: lacking 7 degrees 07/31/21 INITIAL  3 Jill Mcdaniel Fus will achieve 110 degrees knee flexion by D/C (see POC end date) to improve ability to safely navigate steps in the community   Baseline: 55 degrees 07/31/21 INITIAL  4 Jill Mcdaniel Fus will be able to stand for >30'' in SLS stance, to show a significant improvement in balance in order to reduce fall risk    Baseline: unable 07/31/21 INITIAL  5 Jill Mcdaniel Fus will improve the following MMTs to >/= 4+/5 to show improvement in strength:  knee ext, hip abd, hip ext    Baseline: unable to test d/t precautions 07/31/21 INITIAL  6 Gait speed 07/31/21 INITIAL    PLAN: PT FREQUENCY: 1-2x/week   PT DURATION: 12 weeks (Ending 07/31/21)   PLANNED INTERVENTIONS: Therapeutic exercises, Therapeutic activity, Neuro Muscular re-education, Gait training, Patient/Family education,  Joint mobilization, Dry Needling, Electrical stimulation, Spinal mobilization and/or manipulation, Moist heat, Taping, Vasopneumatic device, Ionotophoresis 4mg /ml Dexamethasone, and Manual therapy   PLAN FOR NEXT SESSION: progress per protocol, take gait and LEFS for goals, see what MD said about crutches vs walker vs no AD due to pt fear of using crutches, reassess her technique for patella mobs     , PT, DPT 05/21/2021, 1:20 PM

## 2021-05-24 ENCOUNTER — Ambulatory Visit: Payer: Medicaid Other | Admitting: Physical Therapy

## 2021-05-24 ENCOUNTER — Encounter: Payer: Self-pay | Admitting: Physical Therapy

## 2021-05-24 ENCOUNTER — Other Ambulatory Visit: Payer: Self-pay

## 2021-05-24 DIAGNOSIS — R600 Localized edema: Secondary | ICD-10-CM

## 2021-05-24 DIAGNOSIS — R2689 Other abnormalities of gait and mobility: Secondary | ICD-10-CM

## 2021-05-24 DIAGNOSIS — M25562 Pain in left knee: Secondary | ICD-10-CM | POA: Diagnosis not present

## 2021-05-24 DIAGNOSIS — M6281 Muscle weakness (generalized): Secondary | ICD-10-CM

## 2021-05-24 NOTE — Therapy (Addendum)
OUTPATIENT PHYSICAL THERAPY TREATMENT NOTE   Patient Name: Jill Mcdaniel MRN: [id number] DOB:[birth year], 45 y.o., female Today's Date: 05/24/2021  PCP: Jill Mcdaniel, No REFERRING PROVIDER: Bjorn Pippin, MD   PT End of Session - 05/24/21 1216     Visit Number 3    Number of Visits 24    Date for PT Re-Evaluation 07/31/21    Authorization Type [employer] MCD    Authorization - Number of Visits 27    Equipment Utilized During Treatment Left knee immobilizer    Activity Tolerance Patient tolerated treatment well;No increased pain    Behavior During Therapy WFL for tasks assessed/performed            In: 12:15 Out 12:58 Total time: 43 min  Past Medical History:  Diagnosis Date   Anemia    Blood transfusion without reported diagnosis 1997   anemia   Seropositive for herpes simplex 2 infection    Vaginal Pap smear, abnormal    Past Surgical History:  Procedure Laterality Date   Abortions     CERVICAL CONIZATION W/BX N/A 03/22/2021   Procedure: COLD KNIFE CONIZATION CERVIX WITH BIOPSY;  Surgeon: Jill Ready, DO;  Location: [facility];  Service: Gynecology;  Laterality: N/A;   CESAREAN SECTION     CESAREAN SECTION WITH BILATERAL TUBAL LIGATION Bilateral 04/07/2014   Procedure: CESAREAN SECTION WITH BILATERAL TUBAL LIGATION;  Surgeon: Jill Chihuahua. Richardson Dopp, MD;  Location: [facility];  Service: Obstetrics;  Laterality: Bilateral;   FRACTURE SURGERY     KNEE ARTHROSCOPY WITH MEDIAL COLLATERAL LIGAMENT RECONSTRUCTION Left 05/01/2021   Procedure: KNEE ARTHROSCOPY WITH MEDIAL COLLATERAL LIGAMENT REPAIR;  Surgeon: Jill Pippin, MD;  Location: [facility];  Service: Orthopedics;  Laterality: Left;   KNEE ARTHROSCOPY WITH POSTERIOR CRUCIATE LIGAMENT (PCL) RECONSTRUCTION Left 05/01/2021   Procedure: KNEE ARTHROSCOPY WITH POSTERIOR CRUCIATE LIGAMENT (PCL) RECONSTRUCTION;  Surgeon: Jill Pippin, MD;  Location:  [facility];  Service: Orthopedics;  Laterality: Left;    Patient Active Problem List   Diagnosis Date Noted   S/P cesarean section 04/07/2014   REFERRING PROVIDER: Bjorn Pippin, MD   REFERRING DIAG: Left Knee Arthroscopy with PCL reconstruction and MCL repair on 05/01/2021    ONSET DATE: 12/07  THERAPY DIAG:  Left knee pain, unspecified chronicity  Local edema  Muscle weakness  Balance problem  PERTINENT HISTORY: None   WEIGHT BEARING RESTRICTIONS WEIGHT BEARING RESTRICTIONS Yes TDWB with knee locked in ext at all times for 6 weeks (no resisted knee flexion or hyper ext for 6 months); follow multiligamentous protocol   SUBJECTIVE:   Pt reports that she has been doing ok.  She does not like the crutches, but does not want a walker so has been dealing with them.  PAIN:  Are you having pain? No VAS scale: 0/10 Pain location: L knee    OBJECTIVE:      LE AROM/PROM:   A/PROM Right 05/08/2021 Left 05/08/2021 12/30  Hip flexion       Hip extension       Hip abduction       Hip adduction       Hip internal rotation       Hip external rotation       Knee flexion 110 55 67  Knee extension WNL Lacking 7 degrees Lacking 5 degrees  Ankle dorsiflexion       Ankle plantarflexion       Ankle inversion  Ankle eversion        (Blank rows = not tested)   LE MMT:   MMT Right 05/08/2021 Left 05/08/2021  Hip flexion      Knee extension      Knee flexion      Hip abduction      Hip extension      Hip external rotation      Hip internal rotation      Hip adduction      Ankle dorsiflexion      Ankle plantarflexion      Ankle inversion      Ankle eversion       (Blank rows = not tested; all scores listed out of a possible 5)        [facility] Adult PT Treatment:       Therapeutic Exercise: Quad sets x 15 with PT max verbal cues for technique SLR with brace  Supine 3x10 S/L 3x5 Prone  Heel slide with strap Flexion stretch in sitting (not today) Quad set with russian stim (10/10 @ 28 mA)  Manual Therapy: L  patella mobs all directions with PT educating pt on proper technique (also documented under Theract)    HOME EXERCISE PROGRAM: Access Code: [email or web address] URL: [email or web address] Date: 05/24/2021 Prepared by: Jill Mcdaniel  Program Notes WEAR BRACE with straight leg raise   Exercises Ice - 5 x daily - 7 x weekly - 1 sets - 1 reps - 20 min hold Long Sitting 4 Way Patellar Glide - 3 x daily - 7 x weekly - 3 sets - 10 reps Sitting Heel Slide with Towel - 2 x daily - 7 x weekly - 3 sets - 10 reps Long Sitting Quad Set - 5 x daily - 7 x weekly - 3 sets - 10 reps Supine Active Straight Leg Raise - 1 x daily - 7 x weekly - 3 sets - 10 reps      ASSESSMENT:   CLINICAL IMPRESSION: Jill Mcdaniel is progressing well with therapy.  Pt reports no increase in baseline pain following therapy.  Today we concentrated on quad strengthening, hip strengthening, and knee range of motion.  Pt progressing flexion ROM as expected.  We discussed the importance of quad activation and that she should palpate the quad during quad sets at home as she has inconsistent activation.  Pt will continue to benefit from skilled physical therapy to address remaining deficits and achieve listed goals.  Continue per POC.     GOALS: Goals reviewed with patient? Yes   SHORT TERM GOALS:   STG Name Target Date Goal status  1 Jill Mcdaniel will be >75% HEP compliant to improve carryover between sessions and facilitate independent management of condition   Baseline: No HEP 05/29/2021 INITIAL    LONG TERM GOALS:    LTG Name Target Date Goal status  1 LEFS 07/31/21 INITIAL  2 Jill Fus will achieve 3 degrees knee extension by D/C (see POC end date) to improve stability in stance phase of gait    Baseline: lacking 7 degrees 07/31/21 INITIAL  3 Jill Fus will achieve 110 degrees knee flexion by D/C (see POC end date) to improve ability to safely navigate steps in the community   Baseline: 55 degrees 07/31/21 INITIAL  4 Jill Fus  will be able to stand for >30'' in SLS stance, to show a significant improvement in balance in order to reduce fall risk    Baseline: unable 07/31/21 INITIAL  5 Jill Fus will improve the following MMTs to >/=  4+/5 to show improvement in strength:  knee ext, hip abd, hip ext    Baseline: unable to test d/t precautions 07/31/21 INITIAL  6 Gait speed 07/31/21 INITIAL    PLAN: PT FREQUENCY: 1-2x/week   PT DURATION: 12 weeks (Ending 07/31/21)   PLANNED INTERVENTIONS: Therapeutic exercises, Therapeutic activity, Neuro Muscular re-education, Gait training, Patient/Family education, Joint mobilization, Dry Needling, Electrical stimulation, Spinal mobilization and/or manipulation, Moist heat, Taping, Vasopneumatic device, Ionotophoresis 4mg /ml Dexamethasone, and Manual therapy   PLAN FOR NEXT SESSION: progress per protocol, take gait and LEFS for goals, see what MD said about crutches vs walker vs no AD due to pt fear of using crutches, reassess her technique for patella mobs     , PT, DPT 05/24/2021, 1:00 PM

## 2021-05-28 ENCOUNTER — Other Ambulatory Visit: Payer: Self-pay

## 2021-05-28 ENCOUNTER — Encounter: Payer: Self-pay | Admitting: Physical Therapy

## 2021-05-28 ENCOUNTER — Ambulatory Visit: Payer: Medicaid Other | Attending: Orthopaedic Surgery | Admitting: Physical Therapy

## 2021-05-28 DIAGNOSIS — R2689 Other abnormalities of gait and mobility: Secondary | ICD-10-CM | POA: Diagnosis present

## 2021-05-28 DIAGNOSIS — M25562 Pain in left knee: Secondary | ICD-10-CM

## 2021-05-28 DIAGNOSIS — R600 Localized edema: Secondary | ICD-10-CM | POA: Diagnosis present

## 2021-05-28 DIAGNOSIS — M6281 Muscle weakness (generalized): Secondary | ICD-10-CM

## 2021-05-28 NOTE — Therapy (Signed)
OUTPATIENT PHYSICAL THERAPY TREATMENT NOTE   Patient Name: Jill Mcdaniel MRN: [id number] DOB:1975/12/20, 46 y.o., female Today's Date: 05/28/2021  PCP: Merryl Hacker, No REFERRING PROVIDER: Hiram Gash, MD   PT End of Session - 05/28/21 1000     Visit Number 4    Number of Visits 24    Date for PT Re-Evaluation 07/31/21    Authorization Type [employer] MCD    Authorization - Number of Visits 27    PT Start Time 1000    PT Stop Time 1042    PT Time Calculation (min) 42 min    Equipment Utilized During Treatment Left knee immobilizer    Activity Tolerance Patient tolerated treatment well;No increased pain    Behavior During Therapy WFL for tasks assessed/performed              Past Medical History:  Diagnosis Date   Anemia    Blood transfusion without reported diagnosis 1997   anemia   Seropositive for herpes simplex 2 infection    Vaginal Pap smear, abnormal    Past Surgical History:  Procedure Laterality Date   Abortions     CERVICAL CONIZATION W/BX N/A 03/22/2021   Procedure: COLD KNIFE CONIZATION CERVIX WITH BIOPSY;  Surgeon: Drema Dallas, DO;  Location: [facility];  Service: Gynecology;  Laterality: N/A;   CESAREAN SECTION     CESAREAN SECTION WITH BILATERAL TUBAL LIGATION Bilateral 04/07/2014   Procedure: CESAREAN SECTION WITH BILATERAL TUBAL LIGATION;  Surgeon: Maeola Sarah. Landry Mellow, MD;  Location: [facility];  Service: Obstetrics;  Laterality: Bilateral;   FRACTURE SURGERY     KNEE ARTHROSCOPY WITH MEDIAL COLLATERAL LIGAMENT RECONSTRUCTION Left 05/01/2021   Procedure: KNEE ARTHROSCOPY WITH MEDIAL COLLATERAL LIGAMENT REPAIR;  Surgeon: Hiram Gash, MD;  Location: [facility];  Service: Orthopedics;  Laterality: Left;   KNEE ARTHROSCOPY WITH POSTERIOR CRUCIATE LIGAMENT (PCL) RECONSTRUCTION Left 05/01/2021   Procedure: KNEE ARTHROSCOPY WITH POSTERIOR CRUCIATE LIGAMENT (PCL) RECONSTRUCTION;  Surgeon: Hiram Gash, MD;  Location: [facility];   Service: Orthopedics;  Laterality: Left;   Patient Active Problem List   Diagnosis Date Noted   S/P cesarean section 04/07/2014   REFERRING PROVIDER: Hiram Gash, MD   REFERRING DIAG: Left Knee Arthroscopy with PCL reconstruction and MCL repair on 05/01/2021    ONSET DATE: 12/07  THERAPY DIAG:  Left knee pain, unspecified chronicity  Local edema  Muscle weakness  Balance problem  Other abnormalities of gait and mobility  PERTINENT HISTORY: None   WEIGHT BEARING RESTRICTIONS WEIGHT BEARING RESTRICTIONS Yes TDWB with knee locked in ext at all times for 6 weeks (no resisted knee flexion or hyper ext for 6 months); follow multiligamentous protocol   SUBJECTIVE:   Pt reports that she went to the MD and everything is looking good.  PAIN:  Are you having pain? No VAS scale: 0/10 Pain location: L knee    OBJECTIVE:      LE AROM/PROM:   A/PROM Right 05/08/2021 Left 05/08/2021 12/30 1/3  Hip flexion        Hip extension        Hip abduction        Hip adduction        Hip internal rotation        Hip external rotation        Knee flexion 110 55 67 80  Knee extension WNL Lacking 7 degrees Lacking 5 degrees   Ankle dorsiflexion  Ankle plantarflexion        Ankle inversion        Ankle eversion         (Blank rows = not tested)   LE MMT:   MMT Right 05/08/2021 Left 05/08/2021  Hip flexion      Knee extension      Knee flexion      Hip abduction      Hip extension      Hip external rotation      Hip internal rotation      Hip adduction      Ankle dorsiflexion      Ankle plantarflexion      Ankle inversion      Ankle eversion       (Blank rows = not tested; all scores listed out of a possible 5)        [facility] Adult PT Treatment:       Therapeutic Exercise: Quad sets x 15 with PT max verbal cues for technique SLR with brace  Supine 3x15 S/L 4x5 Prone 3x10 Heel slide with strap  Quad set with russian stim (5/5 @ 32 mA)  Manual  Therapy: L patella mobs all directions with PT educating pt on proper technique    HOME EXERCISE PROGRAM: Access Code: [email or web address] URL: [email or web address] Date: 05/24/2021 Prepared by: [name]  Program Notes WEAR BRACE with straight leg raise   Exercises Ice - 5 x daily - 7 x weekly - 1 sets - 1 reps - 20 min hold Long Sitting 4 Way Patellar Glide - 3 x daily - 7 x weekly - 3 sets - 10 reps Sitting Heel Slide with Towel - 2 x daily - 7 x weekly - 3 sets - 10 reps Long Sitting Quad Set - 5 x daily - 7 x weekly - 3 sets - 10 reps Supine Active Straight Leg Raise - 1 x daily - 7 x weekly - 3 sets - 10 reps      ASSESSMENT:   CLINICAL IMPRESSION: Jill Mcdaniel is progressing well with therapy.  Pt reports no increase in baseline pain following therapy.  Today we concentrated on quad strengthening and knee range of motion.  ROM progressing well.  Pt is still having inconsistent quad activation, but this is improving.  Pt will continue to benefit from skilled physical therapy to address remaining deficits and achieve listed goals.  Continue per POC.     GOALS: Goals reviewed with patient? Yes   SHORT TERM GOALS:   STG Name Target Date Goal status  1 Jill Mcdaniel will be >75% HEP compliant to improve carryover between sessions and facilitate independent management of condition   Baseline: No HEP 05/29/2021 MET 1/3    LONG TERM GOALS:    LTG Name Target Date Goal status  1 LEFS 07/31/21 INITIAL  2 Jill Mcdaniel will achieve 3 degrees knee extension by D/C (see POC end date) to improve stability in stance phase of gait    Baseline: lacking 7 degrees 07/31/21 INITIAL  3 Jill Mcdaniel will achieve 110 degrees knee flexion by D/C (see POC end date) to improve ability to safely navigate steps in the community   Baseline: 55 degrees 07/31/21 INITIAL  4 Jill Mcdaniel will be able to stand for >30'' in SLS stance, to show a significant improvement in balance in order to reduce fall risk     Baseline: unable 07/31/21 INITIAL  5 Jill Mcdaniel will improve the following MMTs to >/= 4+/5 to show improvement in strength:  knee ext, hip abd, hip ext    Baseline: unable to test d/t precautions 07/31/21 INITIAL  6 Gait speed 07/31/21 INITIAL    PLAN: PT FREQUENCY: 1-2x/week   PT DURATION: 12 weeks (Ending 07/31/21)   PLANNED INTERVENTIONS: Therapeutic exercises, Therapeutic activity, Neuro Muscular re-education, Gait training, Patient/Family education, Joint mobilization, Dry Needling, Electrical stimulation, Spinal mobilization and/or manipulation, Moist heat, Taping, Vasopneumatic device, Ionotophoresis 73m/ml Dexamethasone, and Manual therapy   PLAN FOR NEXT SESSION: progress per protocol, take gait and LEFS for goals, see what MD said about crutches vs walker vs no AD due to pt fear of using crutches, reassess her technique for patella mobs     KMathis Dad PT, DPT 05/28/2021, 10:01 AM

## 2021-05-30 ENCOUNTER — Other Ambulatory Visit: Payer: Self-pay

## 2021-05-30 ENCOUNTER — Ambulatory Visit: Payer: Medicaid Other | Admitting: Physical Therapy

## 2021-05-30 ENCOUNTER — Encounter: Payer: Self-pay | Admitting: Physical Therapy

## 2021-05-30 DIAGNOSIS — M25562 Pain in left knee: Secondary | ICD-10-CM | POA: Diagnosis not present

## 2021-05-30 DIAGNOSIS — R2689 Other abnormalities of gait and mobility: Secondary | ICD-10-CM

## 2021-05-30 DIAGNOSIS — R600 Localized edema: Secondary | ICD-10-CM

## 2021-05-30 DIAGNOSIS — M6281 Muscle weakness (generalized): Secondary | ICD-10-CM

## 2021-05-30 NOTE — Therapy (Signed)
OUTPATIENT PHYSICAL THERAPY TREATMENT NOTE   Patient Name: Jill Mcdaniel MRN: [id number] DOB:November 21, 1975, 46 y.o., female Today's Date: 05/30/2021  PCP: Jill Mcdaniel, No REFERRING PROVIDER: Hiram Gash, MD   PT End of Session - 05/30/21 1051     Visit Number 5    Number of Visits 24    Date for PT Re-Evaluation 07/31/21    Authorization Type [employer] MCD    Authorization - Number of Visits 27    PT Start Time 4496    PT Stop Time 1130    PT Time Calculation (min) 39 min    Equipment Utilized During Treatment Left knee immobilizer    Activity Tolerance Patient tolerated treatment well;No increased pain    Behavior During Therapy WFL for tasks assessed/performed              Past Medical History:  Diagnosis Date   Anemia    Blood transfusion without reported diagnosis 1997   anemia   Seropositive for herpes simplex 2 infection    Vaginal Pap smear, abnormal    Past Surgical History:  Procedure Laterality Date   Abortions     CERVICAL CONIZATION W/BX N/A 03/22/2021   Procedure: COLD KNIFE CONIZATION CERVIX WITH BIOPSY;  Surgeon: Jill Dallas, DO;  Location: [facility];  Service: Gynecology;  Laterality: N/A;   CESAREAN SECTION     CESAREAN SECTION WITH BILATERAL TUBAL LIGATION Bilateral 04/07/2014   Procedure: CESAREAN SECTION WITH BILATERAL TUBAL LIGATION;  Surgeon: Jill Sarah. Landry Mellow, MD;  Location: [facility];  Service: Obstetrics;  Laterality: Bilateral;   FRACTURE SURGERY     KNEE ARTHROSCOPY WITH MEDIAL COLLATERAL LIGAMENT RECONSTRUCTION Left 05/01/2021   Procedure: KNEE ARTHROSCOPY WITH MEDIAL COLLATERAL LIGAMENT REPAIR;  Surgeon: Jill Gash, MD;  Location: [facility];  Service: Orthopedics;  Laterality: Left;   KNEE ARTHROSCOPY WITH POSTERIOR CRUCIATE LIGAMENT (PCL) RECONSTRUCTION Left 05/01/2021   Procedure: KNEE ARTHROSCOPY WITH POSTERIOR CRUCIATE LIGAMENT (PCL) RECONSTRUCTION;  Surgeon: Jill Gash, MD;  Location: [facility];   Service: Orthopedics;  Laterality: Left;   Patient Active Problem List   Diagnosis Date Noted   S/P cesarean section 04/07/2014   REFERRING PROVIDER: Hiram Gash, MD   REFERRING DIAG: Left Knee Arthroscopy with PCL reconstruction and MCL repair on 05/01/2021    ONSET DATE: 12/07  THERAPY DIAG:  Left knee pain, unspecified chronicity  Local edema  Muscle weakness  Balance problem  Other abnormalities of gait and mobility  PERTINENT HISTORY: None   WEIGHT BEARING RESTRICTIONS WEIGHT BEARING RESTRICTIONS Yes TDWB with knee locked in ext at all times for 6 weeks (no resisted knee flexion or hyper ext for 6 months); follow multiligamentous protocol   SUBJECTIVE:   Pt reports her knee is doing well.  She has been working on flexion at home.  PAIN:  Are you having pain? No VAS scale: 0/10 Pain location: L knee    OBJECTIVE:      LE AROM/PROM:   A/PROM Right 05/08/2021 Left 05/08/2021 12/30 1/3 1/5  Hip flexion         Hip extension         Hip abduction         Hip adduction         Hip internal rotation         Hip external rotation         Knee flexion 110 55 67 80 85  Knee extension WNL Lacking 7 degrees  Lacking 5 degrees    Ankle dorsiflexion         Ankle plantarflexion         Ankle inversion         Ankle eversion          (Blank rows = not tested)   LE MMT:   MMT Right 05/08/2021 Left 05/08/2021  Hip flexion      Knee extension      Knee flexion      Hip abduction      Hip extension      Hip external rotation      Hip internal rotation      Hip adduction      Ankle dorsiflexion      Ankle plantarflexion      Ankle inversion      Ankle eversion       (Blank rows = not tested; all scores listed out of a possible 5)        [facility] Adult PT Treatment:       Therapeutic Exercise: Quad sets x 15 with PT max verbal cues for technique SLR with brace  Supine 3x15 S/L 3x10 Prone 3x10 Heel slide with strap Quad set w/ BFR (196 mmHg, pt in  supine, 30-15x3)  Manual Therapy: L patella mobs all directions with PT educating pt on proper technique    HOME EXERCISE PROGRAM: Access Code: [email or web address] URL: [email or web address] Date: 05/24/2021 Prepared by: Jill Mcdaniel  Program Notes WEAR BRACE with straight leg raise   Exercises Ice - 5 x daily - 7 x weekly - 1 sets - 1 reps - 20 min hold Long Sitting 4 Way Patellar Glide - 3 x daily - 7 x weekly - 3 sets - 10 reps Sitting Heel Slide with Towel - 2 x daily - 7 x weekly - 3 sets - 10 reps Long Sitting Quad Set - 5 x daily - 7 x weekly - 3 sets - 10 reps Supine Active Straight Leg Raise - 1 x daily - 7 x weekly - 3 sets - 10 reps      ASSESSMENT:   CLINICAL IMPRESSION: Jill Mcdaniel is progressing well with therapy.  Pt reports no increase in baseline pain following therapy.  Today we concentrated on quad strengthening and knee range of motion.  Pt tolerates BFR well, we will continue with this.  Flexion to 85 today; progressing well with this.  Pt will continue to benefit from skilled physical therapy to address remaining deficits and achieve listed goals.  Continue per POC.     GOALS: Goals reviewed with patient? Yes   SHORT TERM GOALS:   STG Name Target Date Goal status  1 Jill Mcdaniel will be >75% HEP compliant to improve carryover between sessions and facilitate independent management of condition   Baseline: No HEP 05/29/2021 MET 1/3    LONG TERM GOALS:    LTG Name Target Date Goal status  1 LEFS 07/31/21 INITIAL  2 Jill Mcdaniel will achieve 3 degrees knee extension by D/C (see POC end date) to improve stability in stance phase of gait    Baseline: lacking 7 degrees 07/31/21 INITIAL  3 Jill Mcdaniel will achieve 110 degrees knee flexion by D/C (see POC end date) to improve ability to safely navigate steps in the community   Baseline: 55 degrees 07/31/21 INITIAL  4 Jill Mcdaniel will be able to stand for >30'' in SLS stance, to show a significant improvement in balance in order  to reduce fall risk  Baseline: unable 07/31/21 INITIAL  5 Jill Mcdaniel will improve the following MMTs to >/= 4+/5 to show improvement in strength:  knee ext, hip abd, hip ext    Baseline: unable to test d/t precautions 07/31/21 INITIAL  6 Gait speed 07/31/21 INITIAL    PLAN: PT FREQUENCY: 1-2x/week   PT DURATION: 12 weeks (Ending 07/31/21)   PLANNED INTERVENTIONS: Therapeutic exercises, Therapeutic activity, Neuro Muscular re-education, Gait training, Patient/Family education, Joint mobilization, Dry Needling, Electrical stimulation, Spinal mobilization and/or manipulation, Moist heat, Taping, Vasopneumatic device, Ionotophoresis 61m/ml Dexamethasone, and Manual therapy   PLAN FOR NEXT SESSION: progress per protocol, take gait and LEFS for goals, see what MD said about crutches vs walker vs no AD due to pt fear of using crutches, reassess her technique for patella mobs     KMathis Dad PT, DPT 05/30/2021, 11:32 AM

## 2021-06-05 ENCOUNTER — Ambulatory Visit: Payer: Medicaid Other | Admitting: Physical Therapy

## 2021-06-07 ENCOUNTER — Encounter: Payer: Self-pay | Admitting: Physical Therapy

## 2021-06-07 ENCOUNTER — Ambulatory Visit: Payer: Medicaid Other | Admitting: Physical Therapy

## 2021-06-07 ENCOUNTER — Other Ambulatory Visit: Payer: Self-pay

## 2021-06-07 DIAGNOSIS — M25562 Pain in left knee: Secondary | ICD-10-CM

## 2021-06-07 DIAGNOSIS — R600 Localized edema: Secondary | ICD-10-CM

## 2021-06-07 DIAGNOSIS — M6281 Muscle weakness (generalized): Secondary | ICD-10-CM

## 2021-06-07 NOTE — Therapy (Signed)
OUTPATIENT PHYSICAL THERAPY TREATMENT NOTE   Patient Name: Jill Mcdaniel MRN: [id number] DOB:09-Mar-1976, 46 y.o., female Today's Date: 06/07/2021  PCP: Merryl Hacker, No REFERRING PROVIDER: Hiram Gash, MD   PT End of Session - 06/07/21 1007     Visit Number 6    Number of Visits 24    Date for PT Re-Evaluation 07/31/21    Authorization Type [employer] MCD    Authorization - Number of Visits 27    PT Start Time 1006    PT Stop Time 1045    PT Time Calculation (min) 39 min    Equipment Utilized During Treatment Left knee immobilizer    Activity Tolerance Patient tolerated treatment well;No increased pain    Behavior During Therapy WFL for tasks assessed/performed              Past Medical History:  Diagnosis Date   Anemia    Blood transfusion without reported diagnosis 1997   anemia   Seropositive for herpes simplex 2 infection    Vaginal Pap smear, abnormal    Past Surgical History:  Procedure Laterality Date   Abortions     CERVICAL CONIZATION W/BX N/A 03/22/2021   Procedure: COLD KNIFE CONIZATION CERVIX WITH BIOPSY;  Surgeon: Drema Dallas, DO;  Location: [facility];  Service: Gynecology;  Laterality: N/A;   CESAREAN SECTION     CESAREAN SECTION WITH BILATERAL TUBAL LIGATION Bilateral 04/07/2014   Procedure: CESAREAN SECTION WITH BILATERAL TUBAL LIGATION;  Surgeon: Maeola Sarah. Landry Mellow, MD;  Location: [facility];  Service: Obstetrics;  Laterality: Bilateral;   FRACTURE SURGERY     KNEE ARTHROSCOPY WITH MEDIAL COLLATERAL LIGAMENT RECONSTRUCTION Left 05/01/2021   Procedure: KNEE ARTHROSCOPY WITH MEDIAL COLLATERAL LIGAMENT REPAIR;  Surgeon: Hiram Gash, MD;  Location: [facility];  Service: Orthopedics;  Laterality: Left;   KNEE ARTHROSCOPY WITH POSTERIOR CRUCIATE LIGAMENT (PCL) RECONSTRUCTION Left 05/01/2021   Procedure: KNEE ARTHROSCOPY WITH POSTERIOR CRUCIATE LIGAMENT (PCL) RECONSTRUCTION;  Surgeon: Hiram Gash, MD;  Location: [facility];   Service: Orthopedics;  Laterality: Left;   Patient Active Problem List   Diagnosis Date Noted   S/P cesarean section 04/07/2014   REFERRING PROVIDER: Hiram Gash, MD   REFERRING DIAG: Left Knee Arthroscopy with PCL reconstruction and MCL repair on 05/01/2021    ONSET DATE: 12/07  THERAPY DIAG:  Left knee pain, unspecified chronicity  Local edema  Muscle weakness  PERTINENT HISTORY: None   WEIGHT BEARING RESTRICTIONS WEIGHT BEARING RESTRICTIONS Yes TDWB with knee locked in ext at all times for 6 weeks (no resisted knee flexion or hyper ext for 6 months); follow multiligamentous protocol   SUBJECTIVE:   Pt reports that she feels fatigued with exercises at home, but overall is doing well.  PAIN:  Are you having pain? No VAS scale: 0/10 Pain location: L knee    OBJECTIVE:      LE AROM/PROM:   A/PROM Right 05/08/2021 Left 05/08/2021 12/30 1/3 1/5   Hip flexion          Hip extension          Hip abduction          Hip adduction          Hip internal rotation          Hip external rotation          Knee flexion 110 55 67 80 85 90  Knee extension WNL Lacking 7 degrees Lacking 5 degrees  Ankle dorsiflexion          Ankle plantarflexion          Ankle inversion          Ankle eversion           (Blank rows = not tested)   LE MMT:   MMT Right 05/08/2021 Left 05/08/2021  Hip flexion      Knee extension      Knee flexion      Hip abduction      Hip extension      Hip external rotation      Hip internal rotation      Hip adduction      Ankle dorsiflexion      Ankle plantarflexion      Ankle inversion      Ankle eversion       (Blank rows = not tested; all scores listed out of a possible 5)        [facility] Adult PT Treatment:       Therapeutic Exercise: Quad sets x 15 with PT max verbal cues for technique SLR with brace  Supine 3x15 S/L 3x10 Prone 3x10 Heel slide without strap Quad set with DF w/ BFR (160 mmHg, pt in supine, 30-15x3)  Manual  Therapy: L patella mobs all directions with PT educating pt on proper technique    HOME EXERCISE PROGRAM: Access Code: [email or web address] URL: [email or web address] Date: 06/07/2021 Prepared by: [name]  Program Notes WEAR BRACE with straight leg raise   Exercises Ice - 5 x daily - 7 x weekly - 1 sets - 1 reps - 20 min hold Long Sitting 4 Way Patellar Glide - 3 x daily - 7 x weekly - 3 sets - 10 reps Long Sitting Quad Set - 5 x daily - 7 x weekly - 3 sets - 10 reps Supine Active Straight Leg Raise - 1 x daily - 7 x weekly - 3 sets - 10 reps Supine Heel Slide - 3 x daily - 7 x weekly - 3 sets - 10 reps       ASSESSMENT:   CLINICAL IMPRESSION: [name] is progressing well with therapy.  Pt reports no increase in baseline pain following therapy.  Today we concentrated on quad strengthening and knee range of motion.  Pt has achieved ROM goal of 90 degrees.  Now moving to active heel slide at home as this is fatiguing for her.  Pt will continue to benefit from skilled physical therapy to address remaining deficits and achieve listed goals.  Continue per POC.     GOALS: Goals reviewed with patient? Yes   SHORT TERM GOALS:   STG Name Target Date Goal status  1 Imelda will be >75% HEP compliant to improve carryover between sessions and facilitate independent management of condition   Baseline: No HEP 05/29/2021 MET 1/3    LONG TERM GOALS:    LTG Name Target Date Goal status  1 LEFS 07/31/21 INITIAL  2 [name] will achieve 3 degrees knee extension by D/C (see POC end date) to improve stability in stance phase of gait    Baseline: lacking 7 degrees 07/31/21 INITIAL  3 [name] will achieve 110 degrees knee flexion by D/C (see POC end date) to improve ability to safely navigate steps in the community   Baseline: 55 degrees 07/31/21 INITIAL  4 [name] will be able to stand for >30'' in SLS stance, to show a significant improvement in balance in order to reduce fall  risk     Baseline: unable 07/31/21 INITIAL  5 [name] will improve the following MMTs to >/= 4+/5 to show improvement in strength:  knee ext, hip abd, hip ext    Baseline: unable to test d/t precautions 07/31/21 INITIAL  6 Gait speed 07/31/21 INITIAL    PLAN: PT FREQUENCY: 1-2x/week   PT DURATION: 12 weeks (Ending 07/31/21)   PLANNED INTERVENTIONS: Therapeutic exercises, Therapeutic activity, Neuro Muscular re-education, Gait training, Patient/Family education, Joint mobilization, Dry Needling, Electrical stimulation, Spinal mobilization and/or manipulation, Moist heat, Taping, Vasopneumatic device, Ionotophoresis 18m/ml Dexamethasone, and Manual therapy   PLAN FOR NEXT SESSION: progress per protocol, take gait and LEFS for goals, see what MD said about crutches vs walker vs no AD due to pt fear of using crutches, reassess her technique for patella mobs     KMathis Dad PT, DPT 06/07/2021, 10:32 AM

## 2021-06-11 ENCOUNTER — Ambulatory Visit: Payer: Medicaid Other | Admitting: Physical Therapy

## 2021-06-11 ENCOUNTER — Encounter: Payer: Self-pay | Admitting: Physical Therapy

## 2021-06-11 ENCOUNTER — Other Ambulatory Visit: Payer: Self-pay

## 2021-06-11 DIAGNOSIS — R2689 Other abnormalities of gait and mobility: Secondary | ICD-10-CM

## 2021-06-11 DIAGNOSIS — M25562 Pain in left knee: Secondary | ICD-10-CM

## 2021-06-11 DIAGNOSIS — M6281 Muscle weakness (generalized): Secondary | ICD-10-CM

## 2021-06-11 DIAGNOSIS — R600 Localized edema: Secondary | ICD-10-CM

## 2021-06-11 NOTE — Therapy (Signed)
OUTPATIENT PHYSICAL THERAPY TREATMENT NOTE   Patient Name: Jill Mcdaniel MRN: [id number] DOB:11/20/1975, 46 y.o., female Today's Date: 06/11/2021  PCP: Merryl Hacker, No REFERRING PROVIDER: Hiram Gash, MD   PT End of Session - 06/11/21 [phone]     Visit Number 7    Number of Visits 24    Date for PT Re-Evaluation 07/31/21    Authorization Type [employer] MCD    PT Start Time 0850    PT Stop Time 0930    PT Time Calculation (min) 40 min    Equipment Utilized During Treatment Left knee immobilizer    Activity Tolerance Patient tolerated treatment well;No increased pain    Behavior During Therapy WFL for tasks assessed/performed               Past Medical History:  Diagnosis Date   Anemia    Blood transfusion without reported diagnosis 1997   anemia   Seropositive for herpes simplex 2 infection    Vaginal Pap smear, abnormal    Past Surgical History:  Procedure Laterality Date   Abortions     CERVICAL CONIZATION W/BX N/A 03/22/2021   Procedure: COLD KNIFE CONIZATION CERVIX WITH BIOPSY;  Surgeon: Drema Dallas, DO;  Location: [facility];  Service: Gynecology;  Laterality: N/A;   CESAREAN SECTION     CESAREAN SECTION WITH BILATERAL TUBAL LIGATION Bilateral 04/07/2014   Procedure: CESAREAN SECTION WITH BILATERAL TUBAL LIGATION;  Surgeon: Maeola Sarah. Landry Mellow, MD;  Location: [facility];  Service: Obstetrics;  Laterality: Bilateral;   FRACTURE SURGERY     KNEE ARTHROSCOPY WITH MEDIAL COLLATERAL LIGAMENT RECONSTRUCTION Left 05/01/2021   Procedure: KNEE ARTHROSCOPY WITH MEDIAL COLLATERAL LIGAMENT REPAIR;  Surgeon: Hiram Gash, MD;  Location: [facility];  Service: Orthopedics;  Laterality: Left;   KNEE ARTHROSCOPY WITH POSTERIOR CRUCIATE LIGAMENT (PCL) RECONSTRUCTION Left 05/01/2021   Procedure: KNEE ARTHROSCOPY WITH POSTERIOR CRUCIATE LIGAMENT (PCL) RECONSTRUCTION;  Surgeon: Hiram Gash, MD;  Location: [facility];  Service: Orthopedics;  Laterality:  Left;   Patient Active Problem List   Diagnosis Date Noted   S/P cesarean section 04/07/2014   REFERRING PROVIDER: Hiram Gash, MD   REFERRING DIAG: Left Knee Arthroscopy with PCL reconstruction and MCL repair on 05/01/2021    ONSET DATE: 12/07  THERAPY DIAG:  Left knee pain, unspecified chronicity  Local edema  Muscle weakness  Other abnormalities of gait and mobility  Balance problem  PERTINENT HISTORY: None   WEIGHT BEARING RESTRICTIONS WEIGHT BEARING RESTRICTIONS Yes TDWB with knee locked in ext at all times for 6 weeks (no resisted knee flexion or hyper ext for 6 months); follow multiligamentous protocol   SUBJECTIVE:   Pt reports that she feels fatigued with exercises at home, but overall is doing well.  PAIN:  Are you having pain? No VAS scale: 0/10 Pain location: L knee    OBJECTIVE:      LE AROM/PROM:   A/PROM Right 05/08/2021 Left 05/08/2021 12/30 1/3 1/5   Hip flexion          Hip extension          Hip abduction          Hip adduction          Hip internal rotation          Hip external rotation          Knee flexion 110 55 67 80 85   Knee extension WNL Lacking 7 degrees  Lacking 5 degrees     Ankle dorsiflexion          Ankle plantarflexion          Ankle inversion          Ankle eversion           (Blank rows = not tested)   LE MMT:   MMT Right 05/08/2021 Left 05/08/2021  Hip flexion      Knee extension      Knee flexion      Hip abduction      Hip extension      Hip external rotation      Hip internal rotation      Hip adduction      Ankle dorsiflexion      Ankle plantarflexion      Ankle inversion      Ankle eversion       (Blank rows = not tested; all scores listed out of a possible 5)     [facility] Adult PT Treatment:                                                DATE: 06-11-21 Therapeutic Exercise: Quad sets  2 x 15 with PT max verbal cues for technique SLR with brace  Supine 3x15 S/L 3x10 Prone 3x10 Heel slide  without strap  Manual Therapy: L patella mobs all directions with PT educating pt on proper technique  and returning demo under guidance of PT and education on scar tissue massage and return demo Self Care: return demo of scar tissue massage and return demo for patellar massage  emphasis on inferior mob technique   Not done this RX  Quad set with DF w/ BFR (160 mmHg, pt in supine, 30-15x3)   [facility] Adult PT Treatment:       Therapeutic Exercise: Quad sets x 15 with PT max verbal cues for technique SLR with brace  Supine 3x15 S/L 3x10 Prone 3x10 Heel slide without strap Quad set with DF w/ BFR (160 mmHg, pt in supine, 30-15x3)  Manual Therapy: L patella mobs all directions with PT educating pt on proper technique    HOME EXERCISE PROGRAM: Access Code: [email or web address] URL: [email or web address] Date: 06/07/2021 Prepared by: [name]  Program Notes WEAR BRACE with straight leg raise   Exercises Ice - 5 x daily - 7 x weekly - 1 sets - 1 reps - 20 min hold Long Sitting 4 Way Patellar Glide - 3 x daily - 7 x weekly - 3 sets - 10 reps Long Sitting Quad Set - 5 x daily - 7 x weekly - 3 sets - 10 reps Supine Active Straight Leg Raise - 1 x daily - 7 x weekly - 3 sets - 10 reps Supine Heel Slide - 3 x daily - 7 x weekly - 3 sets - 10 reps       ASSESSMENT:   CLINICAL IMPRESSION: [name] is progressing well  Pt reports no increase in baseline pain following therapy.  Today we concentrated on quad strength, knee ROM and scar tissue massage and self patellar mobs.  Pt actively can reach 90 degrees with ease.    Pt will continue to benefit from skilled physical therapy to address remaining deficits and achieve listed goals.  Continue per POC.     GOALS: Goals reviewed with patient? Yes  SHORT TERM GOALS:   STG Name Target Date Goal status  1 [name] will be >75% HEP compliant to improve carryover between sessions and facilitate independent management of  condition   Baseline: No HEP 05/29/2021 MET 1/3    LONG TERM GOALS:    LTG Name Target Date Goal status  1 LEFS 07/31/21 INITIAL  2 [name] will achieve 3 degrees knee extension by D/C (see POC end date) to improve stability in stance phase of gait    Baseline: lacking 7 degrees 07/31/21 INITIAL  3 [name] will achieve 110 degrees knee flexion by D/C (see POC end date) to improve ability to safely navigate steps in the community   Baseline: 55 degrees 07/31/21 INITIAL  4 [name] will be able to stand for >30'' in SLS stance, to show a significant improvement in balance in order to reduce fall risk    Baseline: unable 07/31/21 INITIAL  5 [name] will improve the following MMTs to >/= 4+/5 to show improvement in strength:  knee ext, hip abd, hip ext    Baseline: unable to test d/t precautions 07/31/21 INITIAL  6 Gait speed 07/31/21 INITIAL    PLAN: PT FREQUENCY: 1-2x/week   PT DURATION: 12 weeks (Ending 07/31/21)   PLANNED INTERVENTIONS: Therapeutic exercises, Therapeutic activity, Neuro Muscular re-education, Gait training, Patient/Family education, Joint mobilization, Dry Needling, Electrical stimulation, Spinal mobilization and/or manipulation, Moist heat, Taping, Vasopneumatic device, Ionotophoresis 73m/ml Dexamethasone, and Manual therapy   PLAN FOR NEXT SESSION: progress per protocol, take gait and LEFS for goals, see what MD said about crutches vs walker vs no AD due to pt fear of using crutches, reassess her technique for patella mobs     LVoncille Lo PT, APontotocCertified Exercise Expert for the Aging Adult  06/11/21 9:30 AM Phone: [phone]: [phone]

## 2021-06-18 ENCOUNTER — Ambulatory Visit: Payer: Medicaid Other | Admitting: Physical Therapy

## 2021-06-18 ENCOUNTER — Encounter: Payer: Self-pay | Admitting: Physical Therapy

## 2021-06-18 ENCOUNTER — Other Ambulatory Visit: Payer: Self-pay

## 2021-06-18 DIAGNOSIS — M6281 Muscle weakness (generalized): Secondary | ICD-10-CM

## 2021-06-18 DIAGNOSIS — R600 Localized edema: Secondary | ICD-10-CM

## 2021-06-18 DIAGNOSIS — M25562 Pain in left knee: Secondary | ICD-10-CM

## 2021-06-18 NOTE — Therapy (Signed)
OUTPATIENT PHYSICAL THERAPY TREATMENT NOTE   Patient Name: Jill Mcdaniel MRN: [id number] DOB:1975/09/09, 46 y.o., female Today's Date: 06/18/2021  PCP: Jill Mcdaniel, No REFERRING PROVIDER: Hiram Gash, MD   PT End of Session - 06/18/21 1259     Visit Number 8    Number of Visits 24    Date for PT Re-Evaluation 07/31/21    Authorization Type [employer] MCD    PT Start Time 1607    PT Stop Time 1215    PT Time Calculation (min) 40 min    Equipment Utilized During Treatment Left knee immobilizer    Activity Tolerance Patient tolerated treatment well;No increased pain    Behavior During Therapy WFL for tasks assessed/performed               Past Medical History:  Diagnosis Date   Anemia    Blood transfusion without reported diagnosis 1997   anemia   Seropositive for herpes simplex 2 infection    Vaginal Pap smear, abnormal    Past Surgical History:  Procedure Laterality Date   Abortions     CERVICAL CONIZATION W/BX N/A 03/22/2021   Procedure: COLD KNIFE CONIZATION CERVIX WITH BIOPSY;  Surgeon: Jill Dallas, DO;  Location: [facility];  Service: Gynecology;  Laterality: N/A;   CESAREAN SECTION     CESAREAN SECTION WITH BILATERAL TUBAL LIGATION Bilateral 04/07/2014   Procedure: CESAREAN SECTION WITH BILATERAL TUBAL LIGATION;  Surgeon: Jill Sarah. Landry Mellow, MD;  Location: [facility];  Service: Obstetrics;  Laterality: Bilateral;   FRACTURE SURGERY     KNEE ARTHROSCOPY WITH MEDIAL COLLATERAL LIGAMENT RECONSTRUCTION Left 05/01/2021   Procedure: KNEE ARTHROSCOPY WITH MEDIAL COLLATERAL LIGAMENT REPAIR;  Surgeon: Jill Gash, MD;  Location: [facility];  Service: Orthopedics;  Laterality: Left;   KNEE ARTHROSCOPY WITH POSTERIOR CRUCIATE LIGAMENT (PCL) RECONSTRUCTION Left 05/01/2021   Procedure: KNEE ARTHROSCOPY WITH POSTERIOR CRUCIATE LIGAMENT (PCL) RECONSTRUCTION;  Surgeon: Jill Gash, MD;  Location: [facility];  Service: Orthopedics;  Laterality:  Left;   Patient Active Problem List   Diagnosis Date Noted   S/P cesarean section 04/07/2014   REFERRING PROVIDER: Hiram Gash, MD   REFERRING DIAG: Left Knee Arthroscopy with PCL reconstruction and MCL repair on 05/01/2021    ONSET DATE: 12/07  THERAPY DIAG:  Left knee pain, unspecified chronicity  Local edema  Muscle weakness  PERTINENT HISTORY: None   WEIGHT BEARING RESTRICTIONS WEIGHT BEARING RESTRICTIONS Yes TDWB with knee locked in ext at all times for 6 weeks (no resisted knee flexion or hyper ext for 6 months); follow multiligamentous protocol   SUBJECTIVE:   Pt reports that she feels her knee is still tight.  PAIN:  Are you having pain? No VAS scale: 0/10 Pain location: L knee    OBJECTIVE:      LE AROM/PROM:   A/PROM Right 05/08/2021 Left 05/08/2021 12/30 1/3 1/5   Hip flexion          Hip extension          Hip abduction          Hip adduction          Hip internal rotation          Hip external rotation          Knee flexion 110 55 67 80 85 90  Knee extension WNL Lacking 7 degrees Lacking 5 degrees   Lacking ~3 degrees ext  Ankle dorsiflexion  Ankle plantarflexion          Ankle inversion          Ankle eversion           (Blank rows = not tested)   LE MMT:   MMT Right 05/08/2021 Left 05/08/2021  Hip flexion      Knee extension      Knee flexion      Hip abduction      Hip extension      Hip external rotation      Hip internal rotation      Hip adduction      Ankle dorsiflexion      Ankle plantarflexion      Ankle inversion      Ankle eversion       (Blank rows = not tested; all scores listed out of a possible 5)        [facility] Adult PT Treatment:       Therapeutic Exercise: Quad sets x 15 with PT max verbal cues for technique SLR Supine 3x15 Supine clam shell - blue TB - 3x15 Heel slide without strap Standing in // w/ brace 3x10 hip abd Hip ext  Heel raise   Gait training: Working on initial w/b using bil  crutches, concentration on heel strike  Manual Therapy (NOT TODAY): L patella mobs all directions with PT educating pt on proper technique Patient Education: - HEP was updated and reissued to patient; pt educated on HEP, was provided handout, and verbally confirmed understanding of exercises.    HOME EXERCISE PROGRAM: Access Code: [email or web address] URL: [email or web address] Date: 06/18/2021 Prepared by: Jill Mcdaniel  Program Notes WEAR BRACE with standing exercises   Exercises Ice - 5 x daily - 7 x weekly - 1 sets - 1 reps - 20 min hold Long Sitting 4 Way Patellar Glide - 3 x daily - 7 x weekly - 3 sets - 10 reps Long Sitting Quad Set - 5 x daily - 7 x weekly - 3 sets - 10 reps Supine Heel Slide with Strap - 1 x daily - 7 x weekly - 3 sets - 10 reps Standing Hip Abduction with Counter Support - 1 x daily - 7 x weekly - 3 sets - 10 reps Standing Hip Extension with Counter Support - 1 x daily - 7 x weekly - 3 sets - 10 reps Heel Raises with Counter Support - 1 x daily - 7 x weekly - 3 sets - 10 reps        ASSESSMENT:  Jill Mcdaniel is progressing well with therapy.  Pt reports no increase in baseline pain following therapy.  Today we concentrated on lower extremity strengthening, hip strengthening, and normalizing gait.  Pt tolerated transition to w/b well with no increase in pain, but reported general LE fatigue at end of session.  HEP updated with instruction to use brace during standing activities; pt confirms understanding.  Pt will continue to benefit from skilled physical therapy to address remaining deficits and achieve listed goals.  Continue per POC.     GOALS: Goals reviewed with patient? Yes   SHORT TERM GOALS:   STG Name Target Date Goal status  1 Jill Mcdaniel will be >75% HEP compliant to improve carryover between sessions and facilitate independent management of condition   Baseline: No HEP 05/29/2021 MET 1/3    LONG TERM GOALS:    LTG Name Target Date Goal  status  1 LEFS 07/31/21 INITIAL  2 Jill Mcdaniel will achieve 3  degrees knee extension by D/C (see POC end date) to improve stability in stance phase of gait    Baseline: lacking 7 degrees 07/31/21 INITIAL  3 Jill Mcdaniel will achieve 110 degrees knee flexion by D/C (see POC end date) to improve ability to safely navigate steps in the community   Baseline: 55 degrees 07/31/21 INITIAL  4  Mayli will be able to stand for >30'' in SLS stance, to show a significant improvement in balance in order to reduce fall risk    Baseline: unable 07/31/21 INITIAL  5 Loyce will improve the following MMTs to >/= 4+/5 to show improvement in strength:  knee ext, hip abd, hip ext    Baseline: unable to test d/t precautions 07/31/21 INITIAL  6 Gait speed 07/31/21 INITIAL    PLAN: PT FREQUENCY: 1-2x/week   PT DURATION: 12 weeks (Ending 07/31/21)   PLANNED INTERVENTIONS: Therapeutic exercises, Therapeutic activity, Neuro Muscular re-education, Gait training, Patient/Family education, Joint mobilization, Dry Needling, Electrical stimulation, Spinal mobilization and/or manipulation, Moist heat, Taping, Vasopneumatic device, Ionotophoresis 9m/ml Dexamethasone, and Manual therapy   PLAN FOR NEXT SESSION: progress per protocol, take gait and LEFS for goals, see what MD said about crutches vs walker vs no AD due to pt fear of using crutches, reassess her technique for patella mobs     KKevan NyReinhartsen PT 06/18/2021, 1:07 PM

## 2021-06-21 ENCOUNTER — Encounter: Payer: Self-pay | Admitting: Physical Therapy

## 2021-06-25 ENCOUNTER — Other Ambulatory Visit: Payer: Self-pay

## 2021-06-25 ENCOUNTER — Encounter: Payer: Self-pay | Admitting: Physical Therapy

## 2021-06-25 ENCOUNTER — Ambulatory Visit: Payer: Medicaid Other | Admitting: Physical Therapy

## 2021-06-25 DIAGNOSIS — M25562 Pain in left knee: Secondary | ICD-10-CM | POA: Diagnosis not present

## 2021-06-25 DIAGNOSIS — M6281 Muscle weakness (generalized): Secondary | ICD-10-CM

## 2021-06-25 DIAGNOSIS — R600 Localized edema: Secondary | ICD-10-CM

## 2021-06-25 NOTE — Therapy (Signed)
°OUTPATIENT PHYSICAL THERAPY TREATMENT NOTE ° ° °Patient Name: Jill Mcdaniel °MRN: [id number] °DOB:05/22/1976, 46 y.o., female °Today's Date: 06/25/2021 ° °PCP: Pcp, No °REFERRING PROVIDER: Varkey, Dax T, MD ° ° PT End of Session - 06/25/21 1129   ° ° Visit Number 9   ° Number of Visits 24   ° Date for PT Re-Evaluation 07/31/21   ° Authorization Type UHC MCD   ° Authorization - Number of Visits 27   ° PT Start Time 1130   ° PT Stop Time 1210   ° PT Time Calculation (min) 40 min   ° Equipment Utilized During Treatment Left knee immobilizer   ° Activity Tolerance Patient tolerated treatment well;No increased pain   ° Behavior During Therapy WFL for tasks assessed/performed   ° °  °  ° °  ° ° ° ° °Past Medical History:  °Diagnosis Date  ° Anemia   ° Blood transfusion without reported diagnosis 1997  ° anemia  ° Seropositive for herpes simplex 2 infection   ° Vaginal Pap smear, abnormal   ° °Past Surgical History:  °Procedure Laterality Date  ° Abortions    ° CERVICAL CONIZATION W/BX N/A 03/22/2021  ° Procedure: COLD KNIFE CONIZATION CERVIX WITH BIOPSY;  Surgeon: Davies, Melissa, DO;  Location: [facility];  Service: Gynecology;  Laterality: N/A;  ° CESAREAN SECTION    ° CESAREAN SECTION WITH BILATERAL TUBAL LIGATION Bilateral 04/07/2014  ° Procedure: CESAREAN SECTION WITH BILATERAL TUBAL LIGATION;  Surgeon: Tara J. Cole, MD;  Location: [facility];  Service: Obstetrics;  Laterality: Bilateral;  ° FRACTURE SURGERY    ° KNEE ARTHROSCOPY WITH MEDIAL COLLATERAL LIGAMENT RECONSTRUCTION Left 05/01/2021  ° Procedure: KNEE ARTHROSCOPY WITH MEDIAL COLLATERAL LIGAMENT REPAIR;  Surgeon: Varkey, Dax T, MD;  Location: [facility];  Service: Orthopedics;  Laterality: Left;  ° KNEE ARTHROSCOPY WITH POSTERIOR CRUCIATE LIGAMENT (PCL) RECONSTRUCTION Left 05/01/2021  ° Procedure: KNEE ARTHROSCOPY WITH POSTERIOR CRUCIATE LIGAMENT (PCL) RECONSTRUCTION;  Surgeon: Varkey, Dax T, MD;  Location: [facility];  Service: Orthopedics;  Laterality: Left;  ° °Patient Active Problem List  ° Diagnosis Date Noted  ° S/P cesarean section 04/07/2014  ° °REFERRING PROVIDER: Varkey, Dax T, MD °  °REFERRING DIAG: Left Knee Arthroscopy with PCL reconstruction and MCL repair on 05/01/2021 °   °ONSET DATE: 12/07 ° °THERAPY DIAG:  °Left knee pain, unspecified chronicity ° °Local edema ° °Muscle weakness ° °PERTINENT HISTORY: None °  °WEIGHT BEARING RESTRICTIONS WEIGHT BEARING RESTRICTIONS Yes TDWB with knee locked in ext at all times for 6 weeks (no resisted knee flexion or hyper ext for 6 months); follow multiligamentous protocol  ° °SUBJECTIVE:  ° °Pt reports that she went to [place] this weekend and her knee was more stiff on the ride.  Overall she is doing well. ° °PAIN:  °Are you having pain? No °VAS scale: 0/10 °Pain location: L knee ° ° ° °OBJECTIVE:  °  °  °LE AROM/PROM: °  °A/PROM Right °05/08/2021 Left °05/08/2021 12/30 1/3 1/5  1/31  °Hip flexion           °Hip extension           °Hip abduction           °Hip adduction           °Hip internal rotation           °Hip external rotation           °Knee   flexion 110 55 67 80 85 90 97  °Knee extension WNL Lacking 7 degrees Lacking 5 degrees   Lacking ~3 degrees ext Lacking 2 degrees  °Ankle dorsiflexion           °Ankle plantarflexion           °Ankle inversion           °Ankle eversion           ° (Blank rows = not tested) °  °LE MMT: °  °MMT Right °05/08/2021 Left °05/08/2021  °Hip flexion      °Knee extension      °Knee flexion      °Hip abduction      °Hip extension      °Hip external rotation      °Hip internal rotation      °Hip adduction      °Ankle dorsiflexion      °Ankle plantarflexion      °Ankle inversion      °Ankle eversion      ° (Blank rows = not tested; all scores listed out of a possible 5) °  °  ° °  °[facility] Adult PT Treatment:      ° °Therapeutic Exercise: °Quad sets x 15 with PT max verbal cues for technique °SLR °Supine 4x10 °Supine clam shell - blue TB -  4x10 °Quad isometrics at 45 degrees 10''x10 °Heel slide with strap ° °Manual Therapy: °L patella mobs all directions with PT educating pt on proper technique ° °Modalities: ° °Vasopneumatic (Game Ready)   ° °Location:  left knee °Time:  10 minutes °Pressure:  low °Temperature:  40 degrees ° ° °  °HOME EXERCISE PROGRAM: °Access Code: [email or web address] °URL: [email or web address] °Date: 06/18/2021 °Prepared by: Karl Reinhartsen ° °Program Notes °WEAR BRACE with standing exercises ° ° °Exercises °Ice - 5 x daily - 7 x weekly - 1 sets - 1 reps - 20 min hold °Long Sitting 4 Way Patellar Glide - 3 x daily - 7 x weekly - 3 sets - 10 reps °Long Sitting Quad Set - 5 x daily - 7 x weekly - 3 sets - 10 reps °Supine Heel Slide with Strap - 1 x daily - 7 x weekly - 3 sets - 10 reps °Standing Hip Abduction with Counter Support - 1 x daily - 7 x weekly - 3 sets - 10 reps °Standing Hip Extension with Counter Support - 1 x daily - 7 x weekly - 3 sets - 10 reps °Heel Raises with Counter Support - 1 x daily - 7 x weekly - 3 sets - 10 reps ° ° ° °  °  °ASSESSMENT: ° °Kamariya is progressing well with therapy.  Pt reports no increase in baseline pain following therapy.  Today we concentrated on quad strengthening and knee range of motion.  Pt presents with no brace today, so we were unable to complete w/b activities.  She has a bit more swelling after walking in [place] over the weekend.  Her flexion is progressing well; she is lacking about 2 degrees of ext.  Plan on moving to 1 crutch next visit.  Pt will continue to benefit from skilled physical therapy to address remaining deficits and achieve listed goals.  Continue per POC. °  °  °GOALS: °Goals reviewed with patient? Yes °  °SHORT TERM GOALS: °  °STG Name Target Date Goal status  °1 Jakira will be >75% HEP compliant to improve carryover between sessions and facilitate independent management   of condition °  °Baseline: No HEP 05/29/2021 MET 1/3  °  °LONG TERM GOALS:  °  °LTG Name  Target Date Goal status  °1 LEFS 07/31/21 INITIAL  °2 Marine will achieve 3 degrees knee extension by D/C (see POC end date) to improve stability in stance phase of gait  °  °Baseline: lacking 7 degrees 07/31/21 INITIAL  °3 Varonica will achieve 110 degrees knee flexion by D/C (see POC end date) to improve ability to safely navigate steps in the community °  °Baseline: 55 degrees 07/31/21 INITIAL  °4  Chrisha will be able to stand for >30'' in SLS stance, to show a significant improvement in balance in order to reduce fall risk  °  °Baseline: unable 07/31/21 INITIAL  °5 Karleigh will improve the following MMTs to >/= 4+/5 to show improvement in strength:  knee ext, hip abd, hip ext  °  °Baseline: unable to test d/t precautions 07/31/21 INITIAL  °6 Gait speed 07/31/21 INITIAL  °  °PLAN: °PT FREQUENCY: 1-2x/week °  °PT DURATION: 12 weeks (Ending 07/31/21) °  °PLANNED INTERVENTIONS: Therapeutic exercises, Therapeutic activity, Neuro Muscular re-education, Gait training, Patient/Family education, Joint mobilization, Dry Needling, Electrical stimulation, Spinal mobilization and/or manipulation, Moist heat, Taping, Vasopneumatic device, Ionotophoresis 4mg/ml Dexamethasone, and Manual therapy °  °PLAN FOR NEXT SESSION: progress per protocol, take gait and LEFS for goals, see what MD said about crutches vs walker vs no AD due to pt fear of using crutches, reassess her technique for patella mobs ° ° ° ° °Karl E Reinhartsen PT °06/25/2021, 12:06 PM ° °   °

## 2021-06-28 ENCOUNTER — Ambulatory Visit: Payer: Medicaid Other | Attending: Orthopaedic Surgery | Admitting: Physical Therapy

## 2021-06-28 ENCOUNTER — Encounter: Payer: Self-pay | Admitting: Physical Therapy

## 2021-06-28 ENCOUNTER — Other Ambulatory Visit: Payer: Self-pay

## 2021-06-28 DIAGNOSIS — R600 Localized edema: Secondary | ICD-10-CM

## 2021-06-28 DIAGNOSIS — R2689 Other abnormalities of gait and mobility: Secondary | ICD-10-CM | POA: Insufficient documentation

## 2021-06-28 DIAGNOSIS — M25562 Pain in left knee: Secondary | ICD-10-CM | POA: Diagnosis not present

## 2021-06-28 DIAGNOSIS — M6281 Muscle weakness (generalized): Secondary | ICD-10-CM | POA: Diagnosis present

## 2021-06-28 NOTE — Therapy (Signed)
OUTPATIENT PHYSICAL THERAPY TREATMENT NOTE   Patient Name: Jill Mcdaniel MRN: [id number] DOB:[birth year], 46 y.o., female Today's Date: 06/28/2021  PCP: Merryl Hacker, No REFERRING PROVIDER: Hiram Gash, MD   PT End of Session - 06/28/21 [phone]     Visit Number 10    Number of Visits 24    Date for PT Re-Evaluation 07/31/21    Authorization Type UHC MCD    Authorization - Number of Visits 27    PT Start Time 0921    PT Stop Time 1000    PT Time Calculation (min) 39 min    Equipment Utilized During Treatment Left knee immobilizer    Activity Tolerance Patient tolerated treatment well;No increased pain    Behavior During Therapy WFL for tasks assessed/performed                Past Medical History:  Diagnosis Date   Anemia    Blood transfusion without reported diagnosis 1997   anemia   Seropositive for herpes simplex 2 infection    Vaginal Pap smear, abnormal    Past Surgical History:  Procedure Laterality Date   Abortions     CERVICAL CONIZATION W/BX N/A 03/22/2021   Procedure: COLD KNIFE CONIZATION CERVIX WITH BIOPSY;  Surgeon: Drema Dallas, DO;  Location: [facility];  Service: Gynecology;  Laterality: N/A;   CESAREAN SECTION     CESAREAN SECTION WITH BILATERAL TUBAL LIGATION Bilateral 04/07/2014   Procedure: CESAREAN SECTION WITH BILATERAL TUBAL LIGATION;  Surgeon: Maeola Sarah. Landry Mellow, MD;  Location: [facility];  Service: Obstetrics;  Laterality: Bilateral;   FRACTURE SURGERY     KNEE ARTHROSCOPY WITH MEDIAL COLLATERAL LIGAMENT RECONSTRUCTION Left 05/01/2021   Procedure: KNEE ARTHROSCOPY WITH MEDIAL COLLATERAL LIGAMENT REPAIR;  Surgeon: Hiram Gash, MD;  Location: [facility];  Service: Orthopedics;  Laterality: Left;   KNEE ARTHROSCOPY WITH POSTERIOR CRUCIATE LIGAMENT (PCL) RECONSTRUCTION Left 05/01/2021   Procedure: KNEE ARTHROSCOPY WITH POSTERIOR CRUCIATE LIGAMENT (PCL) RECONSTRUCTION;  Surgeon: Hiram Gash, MD;  Location: [facility];  Service: Orthopedics;  Laterality: Left;   Patient Active Problem List   Diagnosis Date Noted   S/P cesarean section 04/07/2014   REFERRING PROVIDER: Hiram Gash, MD   REFERRING DIAG: Left Knee Arthroscopy with PCL reconstruction and MCL repair on 05/01/2021    ONSET DATE: 12/07  THERAPY DIAG:  Left knee pain, unspecified chronicity  Local edema  Muscle weakness  PERTINENT HISTORY: None   WEIGHT BEARING RESTRICTIONS WEIGHT BEARING RESTRICTIONS Yes TDWB with knee locked in ext at all times for 6 weeks (no resisted knee flexion or hyper ext for 6 months); follow multiligamentous protocol   SUBJECTIVE:   Pt reports that she is doing well.  The swelling in her knee has reduced significantly  PAIN:  Are you having pain? No VAS scale: 0/10 Pain location: L knee    OBJECTIVE:      LE AROM/PROM:   A/PROM Right 05/08/2021 Left 05/08/2021 12/30 1/3 1/5  1/31 2/3  Hip flexion            Hip extension            Hip abduction            Hip adduction            Hip internal rotation            Hip external rotation            Knee  flexion 110 55 67 80 85 90 97 100  Knee extension WNL Lacking 7 degrees Lacking 5 degrees   Lacking ~3 degrees ext Lacking 2 degrees   Ankle dorsiflexion            Ankle plantarflexion            Ankle inversion            Ankle eversion             (Blank rows = not tested)   LE MMT:   MMT Right 05/08/2021 Left 05/08/2021  Hip flexion      Knee extension      Knee flexion      Hip abduction      Hip extension      Hip external rotation      Hip internal rotation      Hip adduction      Ankle dorsiflexion      Ankle plantarflexion      Ankle inversion      Ankle eversion       (Blank rows = not tested; all scores listed out of a possible 5)        [facility] Adult PT Treatment:       Therapeutic Exercise: Heelslides with strap x30 Medial patellar glides Quad set - 20x SLR 3x15 - 3 way Gait in // bars sidestep,  fwd and bwd 2 trips each single crutch or bar  Gait training with single crutch, beginning in // bars, emphasizing heel strike and advancing to retro-walking and sidestepping to become comfortable with directional changes Quad isometrics in 90/70/45d flexion, 5s hold 10 reps each angle (NT, add back in) Standing hip abd 3x10     HOME EXERCISE PROGRAM: Access Code: [email or web address] URL: [email or web address] Date: 06/18/2021 Prepared by: [name]  Program Notes WEAR BRACE with standing exercises   Exercises Ice - 5 x daily - 7 x weekly - 1 sets - 1 reps - 20 min hold Long Sitting 4 Way Patellar Glide - 3 x daily - 7 x weekly - 3 sets - 10 reps Long Sitting Quad Set - 5 x daily - 7 x weekly - 3 sets - 10 reps Supine Heel Slide with Strap - 1 x daily - 7 x weekly - 3 sets - 10 reps Standing Hip Abduction with Counter Support - 1 x daily - 7 x weekly - 3 sets - 10 reps Standing Hip Extension with Counter Support - 1 x daily - 7 x weekly - 3 sets - 10 reps Heel Raises with Counter Support - 1 x daily - 7 x weekly - 3 sets - 10 reps        ASSESSMENT:  [name] is progressing well with therapy.  Pt reports no increase in baseline pain following therapy.  Today we concentrated on knee strengthening, hip strengthening, knee range of motion, and normalizing gait.  Pt displays good form with moving to single crutch with initial verbal cuing; then independent w/o cuing.  Will continue to progress as appropriate.  Pt will continue to benefit from skilled physical therapy to address remaining deficits and achieve listed goals.  Continue per POC.   GOALS: Goals reviewed with patient? Yes   SHORT TERM GOALS:   STG Name Target Date Goal status  1 Yezenia will be >75% HEP compliant to improve carryover between sessions and facilitate independent management of condition   Baseline: No HEP 05/29/2021 MET 1/3    LONG TERM  GOALS:    LTG Name Target Date Goal status  1 LEFS 07/31/21  INITIAL  2 Kristyl will achieve 3 degrees knee extension by D/C (see POC end date) to improve stability in stance phase of gait    Baseline: lacking 7 degrees 07/31/21 INITIAL  3 Eldena will achieve 110 degrees knee flexion by D/C (see POC end date) to improve ability to safely navigate steps in the community   Baseline: 55 degrees 07/31/21 INITIAL  4  Ada will be able to stand for >30'' in SLS stance, to show a significant improvement in balance in order to reduce fall risk    Baseline: unable 07/31/21 INITIAL  5 Logyn will improve the following MMTs to >/= 4+/5 to show improvement in strength:  knee ext, hip abd, hip ext    Baseline: unable to test d/t precautions 07/31/21 INITIAL  6 Gait speed 07/31/21 INITIAL    PLAN: PT FREQUENCY: 1-2x/week   PT DURATION: 12 weeks (Ending 07/31/21)   PLANNED INTERVENTIONS: Therapeutic exercises, Therapeutic activity, Neuro Muscular re-education, Gait training, Patient/Family education, Joint mobilization, Dry Needling, Electrical stimulation, Spinal mobilization and/or manipulation, Moist heat, Taping, Vasopneumatic device, Ionotophoresis 77m/ml Dexamethasone, and Manual therapy   PLAN FOR NEXT SESSION: progress per protocol, take gait and LEFS for goals, see what MD said about crutches vs walker vs no AD due to pt fear of using crutches, reassess her technique for patella mobs     KKevan NyReinhartsen PT 06/28/2021, 10:01 AM

## 2021-07-02 ENCOUNTER — Ambulatory Visit: Payer: Medicaid Other | Admitting: Physical Therapy

## 2021-07-02 ENCOUNTER — Other Ambulatory Visit: Payer: Self-pay

## 2021-07-02 ENCOUNTER — Encounter: Payer: Self-pay | Admitting: Physical Therapy

## 2021-07-02 DIAGNOSIS — M25562 Pain in left knee: Secondary | ICD-10-CM

## 2021-07-02 DIAGNOSIS — M6281 Muscle weakness (generalized): Secondary | ICD-10-CM

## 2021-07-02 DIAGNOSIS — R600 Localized edema: Secondary | ICD-10-CM

## 2021-07-02 NOTE — Therapy (Signed)
OUTPATIENT PHYSICAL THERAPY TREATMENT NOTE   Patient Name: Jill Mcdaniel MRN: [id number] DOB:Apr 27, 1976, 46 y.o., female Today's Date: 07/02/2021  PCP: Merryl Hacker, No REFERRING PROVIDER: Hiram Gash, MD   PT End of Session - 07/02/21 1051     Visit Number 11    Number of Visits 24    Date for PT Re-Evaluation 07/31/21    Authorization Type UHC MCD    Authorization - Number of Visits 27    PT Start Time 6579    PT Stop Time 1130    PT Time Calculation (min) 39 min    Equipment Utilized During Treatment Left knee immobilizer    Activity Tolerance Patient tolerated treatment well;No increased pain    Behavior During Therapy WFL for tasks assessed/performed                Past Medical History:  Diagnosis Date   Anemia    Blood transfusion without reported diagnosis 1997   anemia   Seropositive for herpes simplex 2 infection    Vaginal Pap smear, abnormal    Past Surgical History:  Procedure Laterality Date   Abortions     CERVICAL CONIZATION W/BX N/A 03/22/2021   Procedure: COLD KNIFE CONIZATION CERVIX WITH BIOPSY;  Surgeon: Drema Dallas, DO;  Location: [place];  Service: Gynecology;  Laterality: N/A;   CESAREAN SECTION     CESAREAN SECTION WITH BILATERAL TUBAL LIGATION Bilateral 04/07/2014   Procedure: CESAREAN SECTION WITH BILATERAL TUBAL LIGATION;  Surgeon: Maeola Sarah. Landry Mellow, MD;  Location: [facility];  Service: Obstetrics;  Laterality: Bilateral;   FRACTURE SURGERY     KNEE ARTHROSCOPY WITH MEDIAL COLLATERAL LIGAMENT RECONSTRUCTION Left 05/01/2021   Procedure: KNEE ARTHROSCOPY WITH MEDIAL COLLATERAL LIGAMENT REPAIR;  Surgeon: Hiram Gash, MD;  Location: [place];  Service: Orthopedics;  Laterality: Left;   KNEE ARTHROSCOPY WITH POSTERIOR CRUCIATE LIGAMENT (PCL) RECONSTRUCTION Left 05/01/2021   Procedure: KNEE ARTHROSCOPY WITH POSTERIOR CRUCIATE LIGAMENT (PCL) RECONSTRUCTION;  Surgeon: Hiram Gash, MD;  Location: [place];  Service: Orthopedics;  Laterality: Left;   Patient Active Problem List   Diagnosis Date Noted   S/P cesarean section 04/07/2014   REFERRING PROVIDER: Hiram Gash, MD   REFERRING DIAG: Left Knee Arthroscopy with PCL reconstruction and MCL repair on 05/01/2021    ONSET DATE: 12/07  THERAPY DIAG:  Left knee pain, unspecified chronicity  Local edema  Muscle weakness  PERTINENT HISTORY: None   WEIGHT BEARING RESTRICTIONS WEIGHT BEARING RESTRICTIONS Yes TDWB with knee locked in ext at all times for 6 weeks (no resisted knee flexion or hyper ext for 6 months); follow multiligamentous protocol   SUBJECTIVE:   Pt states that things are going well.  She has been working on her flexion at home.  PAIN:  Are you having pain? No VAS scale: 0/10 Pain location: L knee    OBJECTIVE:      LE AROM/PROM:   A/PROM Right 05/08/2021 Left 05/08/2021 12/30 1/3 1/5  1/31 2/3 2/7  Hip flexion             Hip extension             Hip abduction             Hip adduction             Hip internal rotation             Hip external rotation  Knee flexion 110 55 67 80 85 90 97 100 103  Knee extension WNL Lacking 7 degrees Lacking 5 degrees   Lacking ~3 degrees ext Lacking 2 degrees    Ankle dorsiflexion             Ankle plantarflexion             Ankle inversion             Ankle eversion              (Blank rows = not tested)   LE MMT:   MMT Right 05/08/2021 Left 05/08/2021  Hip flexion      Knee extension      Knee flexion      Hip abduction      Hip extension      Hip external rotation      Hip internal rotation      Hip adduction      Ankle dorsiflexion      Ankle plantarflexion      Ankle inversion      Ankle eversion       (Blank rows = not tested; all scores listed out of a possible 5)        [facility] Adult PT Treatment:       Therapeutic Exercise: Heelslides with strap x30 Medial patellar glides SLR 3x15 - 3 way - 2# Gait in // bars  sidestep, fwd and bwd 2 trips each with no crutch Gait training with no crutch, beginning in // bars, emphasizing heel strike and advancing to retro-walking and sidestepping to become comfortable with directional changes Quad isometrics in 90/70/45d flexion, 5s hold 10 reps each angle Tandem on foam - bouts of 45'' Standing hip abd 3x10 Hip ext  Heel raise         HOME EXERCISE PROGRAM: Access Code: [email or web address] URL: [email or web address] Date: 06/18/2021 Prepared by: [name]  Program Notes WEAR BRACE with standing exercises   Exercises Ice - 5 x daily - 7 x weekly - 1 sets - 1 reps - 20 min hold Long Sitting 4 Way Patellar Glide - 3 x daily - 7 x weekly - 3 sets - 10 reps Long Sitting Quad Set - 5 x daily - 7 x weekly - 3 sets - 10 reps Supine Heel Slide with Strap - 1 x daily - 7 x weekly - 3 sets - 10 reps Standing Hip Abduction with Counter Support - 1 x daily - 7 x weekly - 3 sets - 10 reps Standing Hip Extension with Counter Support - 1 x daily - 7 x weekly - 3 sets - 10 reps Heel Raises with Counter Support - 1 x daily - 7 x weekly - 3 sets - 10 reps        ASSESSMENT:  Jill Mcdaniel is progressing well with therapy.  Pt reports no increase in baseline pain following therapy.  Today we concentrated on  move to walking with no AD .  Pt tolerates walking with no crutch well with cues to emphasize heel strike.  Plan on moving to no AD at home next session.  Pt will continue to benefit from skilled physical therapy to address remaining deficits and achieve listed goals.  Continue per POC.   GOALS: Goals reviewed with patient? Yes   SHORT TERM GOALS:   STG Name Target Date Goal status  1 Jill Mcdaniel will be >75% HEP compliant to improve carryover between sessions and facilitate independent management of condition  Baseline: No HEP 05/29/2021 MET 1/3    LONG TERM GOALS:    LTG Name Target Date Goal status  1 LEFS 07/31/21 INITIAL  2 Jill Mcdaniel will achieve 3  degrees knee extension by D/C (see POC end date) to improve stability in stance phase of gait    Baseline: lacking 7 degrees 07/31/21 INITIAL  3 Jill Mcdaniel will achieve 110 degrees knee flexion by D/C (see POC end date) to improve ability to safely navigate steps in the community   Baseline: 55 degrees 07/31/21 INITIAL  4  Jill Mcdaniel will be able to stand for >30'' in SLS stance, to show a significant improvement in balance in order to reduce fall risk    Baseline: unable 07/31/21 INITIAL  5 Jill Mcdaniel will improve the following MMTs to >/= 4+/5 to show improvement in strength:  knee ext, hip abd, hip ext    Baseline: unable to test d/t precautions 07/31/21 INITIAL  6 Gait speed 07/31/21 INITIAL    PLAN: PT FREQUENCY: 1-2x/week   PT DURATION: 12 weeks (Ending 07/31/21)   PLANNED INTERVENTIONS: Therapeutic exercises, Therapeutic activity, Neuro Muscular re-education, Gait training, Patient/Family education, Joint mobilization, Dry Needling, Electrical stimulation, Spinal mobilization and/or manipulation, Moist heat, Taping, Vasopneumatic device, Ionotophoresis 30m/ml Dexamethasone, and Manual therapy   PLAN FOR NEXT SESSION: progress per protocol, take gait and LEFS for goals, see what MD said about crutches vs walker vs no AD due to pt fear of using crutches, reassess her technique for patella mobs     KKevan NyReinhartsen PT 07/02/2021, 11:32 AM

## 2021-07-05 ENCOUNTER — Ambulatory Visit: Payer: Medicaid Other | Admitting: Physical Therapy

## 2021-07-05 ENCOUNTER — Other Ambulatory Visit: Payer: Self-pay

## 2021-07-05 ENCOUNTER — Encounter: Payer: Self-pay | Admitting: Physical Therapy

## 2021-07-05 DIAGNOSIS — M25562 Pain in left knee: Secondary | ICD-10-CM | POA: Diagnosis not present

## 2021-07-05 DIAGNOSIS — R600 Localized edema: Secondary | ICD-10-CM

## 2021-07-05 DIAGNOSIS — M6281 Muscle weakness (generalized): Secondary | ICD-10-CM

## 2021-07-05 NOTE — Therapy (Signed)
OUTPATIENT PHYSICAL THERAPY TREATMENT NOTE   Patient Name: Jill Mcdaniel MRN: [id number] DOB:October 28, 1975, 46 y.o., female Today's Date: 07/05/2021  PCP: Merryl Hacker, No REFERRING PROVIDER: Hiram Gash, MD   PT End of Session - 07/05/21 1058     Visit Number 12    Number of Visits 24    Date for PT Re-Evaluation 07/31/21    Authorization Type [employer] MCD    Authorization - Number of Visits 27    PT Start Time 7253    PT Stop Time 1128    PT Time Calculation (min) 30 min    Equipment Utilized During Treatment Left knee immobilizer    Activity Tolerance Patient tolerated treatment well;No increased pain    Behavior During Therapy WFL for tasks assessed/performed                 Past Medical History:  Diagnosis Date   Anemia    Blood transfusion without reported diagnosis 1997   anemia   Seropositive for herpes simplex 2 infection    Vaginal Pap smear, abnormal    Past Surgical History:  Procedure Laterality Date   Abortions     CERVICAL CONIZATION W/BX N/A 03/22/2021   Procedure: COLD KNIFE CONIZATION CERVIX WITH BIOPSY;  Surgeon: Drema Dallas, DO;  Location: [facility];  Service: Gynecology;  Laterality: N/A;   CESAREAN SECTION     CESAREAN SECTION WITH BILATERAL TUBAL LIGATION Bilateral 04/07/2014   Procedure: CESAREAN SECTION WITH BILATERAL TUBAL LIGATION;  Surgeon: Maeola Sarah. Landry Mellow, MD;  Location: [facility];  Service: Obstetrics;  Laterality: Bilateral;   FRACTURE SURGERY     KNEE ARTHROSCOPY WITH MEDIAL COLLATERAL LIGAMENT RECONSTRUCTION Left 05/01/2021   Procedure: KNEE ARTHROSCOPY WITH MEDIAL COLLATERAL LIGAMENT REPAIR;  Surgeon: Hiram Gash, MD;  Location: [place];  Service: Orthopedics;  Laterality: Left;   KNEE ARTHROSCOPY WITH POSTERIOR CRUCIATE LIGAMENT (PCL) RECONSTRUCTION Left 05/01/2021   Procedure: KNEE ARTHROSCOPY WITH POSTERIOR CRUCIATE LIGAMENT (PCL) RECONSTRUCTION;  Surgeon: Hiram Gash, MD;  Location: [place];  Service: Orthopedics;  Laterality: Left;   Patient Active Problem List   Diagnosis Date Noted   S/P cesarean section 04/07/2014   REFERRING PROVIDER: Hiram Gash, MD   REFERRING DIAG: Left Knee Arthroscopy with PCL reconstruction and MCL repair on 05/01/2021    ONSET DATE: 12/07  THERAPY DIAG:  Left knee pain, unspecified chronicity  Local edema  Muscle weakness  PERTINENT HISTORY: None   WEIGHT BEARING RESTRICTIONS WEIGHT BEARING RESTRICTIONS Yes TDWB with knee locked in ext at all times for 6 weeks (no resisted knee flexion or hyper ext for 6 months); follow multiligamentous protocol   SUBJECTIVE:   Pt reports that things are going well.  She continues to work on flexion at home and is surprised she does not have easier bending yet.  PAIN:  Are you having pain? No VAS scale: 0/10 Pain location: L knee    OBJECTIVE:      LE AROM/PROM:   A/PROM Right 05/08/2021 Left 05/08/2021 12/30 1/3 1/5  1/31 2/3 2/7  Hip flexion             Hip extension             Hip abduction             Hip adduction             Hip internal rotation  Hip external rotation             Knee flexion 110 55 67 80 85 90 97 100 103  Knee extension WNL Lacking 7 degrees Lacking 5 degrees   Lacking ~3 degrees ext Lacking 2 degrees    Ankle dorsiflexion             Ankle plantarflexion             Ankle inversion             Ankle eversion              (Blank rows = not tested)   LE MMT:   MMT Right 05/08/2021 Left 05/08/2021  Hip flexion      Knee extension      Knee flexion      Hip abduction      Hip extension      Hip external rotation      Hip internal rotation      Hip adduction      Ankle dorsiflexion      Ankle plantarflexion      Ankle inversion      Ankle eversion       (Blank rows = not tested; all scores listed out of a possible 5)        [facility] Adult PT Treatment:       Therapeutic Exercise: Heelslides with strap x30 SLR 3x15 - 3 way -  3# Gait in // bars sidestep, fwd and bwd 2 trips each with no crutchs Gait training with no crutch, beginning in // bars, emphasizing heel strike and advancing to retro-walking and sidestepping to become comfortable with directional changes Quad isometrics in 90/70/45d flexion, 5s hold 10 reps each angle (NT) Tandem on foam - bouts of 45'' In // Hip ext - 30x - GTB above knees Hip abd - 30x - GTB above knees Lateral walking with GTB above knees Heel raise 30x - Blue rocker board - 2x20          HOME EXERCISE PROGRAM: Access Code: [email or web address] URL: [email or web address] Date: 06/18/2021 Prepared by: [name]  Program Notes WEAR BRACE with standing exercises   Exercises Ice - 5 x daily - 7 x weekly - 1 sets - 1 reps - 20 min hold Long Sitting 4 Way Patellar Glide - 3 x daily - 7 x weekly - 3 sets - 10 reps Long Sitting Quad Set - 5 x daily - 7 x weekly - 3 sets - 10 reps Supine Heel Slide with Strap - 1 x daily - 7 x weekly - 3 sets - 10 reps Standing Hip Abduction with Counter Support - 1 x daily - 7 x weekly - 3 sets - 10 reps Standing Hip Extension with Counter Support - 1 x daily - 7 x weekly - 3 sets - 10 reps Heel Raises with Counter Support - 1 x daily - 7 x weekly - 3 sets - 10 reps        ASSESSMENT:  Jill Mcdaniel is progressing well with therapy.  Pt reports no increase in baseline pain following therapy.  Today we concentrated on  comfort with walking with no AD .  Pt able expresses comfort and no pain with ambulation with no crutches.  We will progress to some mini closed chain movements next week.  Pt will continue to benefit from skilled physical therapy to address remaining deficits and achieve listed goals.  Continue per POC.   GOALS:  Goals reviewed with patient? Yes   SHORT TERM GOALS:   STG Name Target Date Goal status  1 [place] will be >75% HEP compliant to improve carryover between sessions and facilitate independent management of  condition   Baseline: No HEP 05/29/2021 MET 1/3    LONG TERM GOALS:    LTG Name Target Date Goal status  1 LEFS 07/31/21 INITIAL  2 Jill Mcdaniel will achieve 3 degrees knee extension by D/C (see POC end date) to improve stability in stance phase of gait    Baseline: lacking 7 degrees  2/10: MET 07/31/21 MET  3 Jill Mcdaniel will achieve 110 degrees knee flexion by D/C (see POC end date) to improve ability to safely navigate steps in the community   Baseline: 55 degrees 07/31/21 INITIAL  4  Tamila will be able to stand for >30'' in SLS stance, to show a significant improvement in balance in order to reduce fall risk    Baseline: unable 07/31/21 INITIAL  5 Markee will improve the following MMTs to >/= 4+/5 to show improvement in strength:  knee ext, hip abd, hip ext    Baseline: unable to test d/t precautions 07/31/21 INITIAL  6 Gait speed 07/31/21 INITIAL    PLAN: PT FREQUENCY: 1-2x/week   PT DURATION: 12 weeks (Ending 07/31/21)   PLANNED INTERVENTIONS: Therapeutic exercises, Therapeutic activity, Neuro Muscular re-education, Gait training, Patient/Family education, Joint mobilization, Dry Needling, Electrical stimulation, Spinal mobilization and/or manipulation, Moist heat, Taping, Vasopneumatic device, Ionotophoresis 33m/ml Dexamethasone, and Manual therapy   PLAN FOR NEXT SESSION: progress per protocol, take gait and LEFS for goals, see what MD said about crutches vs walker vs no AD due to pt fear of using crutches, reassess her technique for patella mobs     KKevan NyReinhartsen PT 07/05/2021, 11:05 AM

## 2021-07-09 ENCOUNTER — Encounter: Payer: Self-pay | Admitting: Physical Therapy

## 2021-07-12 ENCOUNTER — Encounter: Payer: Self-pay | Admitting: Physical Therapy

## 2021-07-12 ENCOUNTER — Other Ambulatory Visit: Payer: Self-pay

## 2021-07-12 ENCOUNTER — Ambulatory Visit: Payer: Medicaid Other | Admitting: Physical Therapy

## 2021-07-12 DIAGNOSIS — R600 Localized edema: Secondary | ICD-10-CM

## 2021-07-12 DIAGNOSIS — R2689 Other abnormalities of gait and mobility: Secondary | ICD-10-CM

## 2021-07-12 DIAGNOSIS — M25562 Pain in left knee: Secondary | ICD-10-CM | POA: Diagnosis not present

## 2021-07-12 DIAGNOSIS — M6281 Muscle weakness (generalized): Secondary | ICD-10-CM

## 2021-07-12 NOTE — Therapy (Signed)
OUTPATIENT PHYSICAL THERAPY TREATMENT NOTE   Patient Name: Jill Mcdaniel MRN: [id number] DOB:[birth year], 46 y.o., female Today's Date: 07/12/2021  PCP: Merryl Hacker, No REFERRING PROVIDER: Hiram Gash, Mcdaniel   PT End of Session - 07/12/21 1046     Visit Number 13    Number of Visits 24    Date for PT Re-Evaluation 07/31/21    Authorization Type [employer] MCD    Authorization - Number of Visits 27    PT Start Time 1045    PT Stop Time 1128    PT Time Calculation (min) 43 min    Equipment Utilized During Treatment Left knee immobilizer    Activity Tolerance Patient tolerated treatment well;No increased pain    Behavior During Therapy WFL for tasks assessed/performed                 Past Medical History:  Diagnosis Date   Anemia    Blood transfusion without reported diagnosis 1997   anemia   Seropositive for herpes simplex 2 infection    Vaginal Pap smear, abnormal    Past Surgical History:  Procedure Laterality Date   Abortions     CERVICAL CONIZATION W/BX N/A 03/22/2021   Procedure: COLD KNIFE CONIZATION CERVIX WITH BIOPSY;  Surgeon: Jill Mcdaniel;  Location: [facility];  Service: Gynecology;  Laterality: N/A;   CESAREAN SECTION     CESAREAN SECTION WITH BILATERAL TUBAL LIGATION Bilateral 04/07/2014   Procedure: CESAREAN SECTION WITH BILATERAL TUBAL LIGATION;  Surgeon: Jill Mcdaniel;  Location: [facility];  Service: Obstetrics;  Laterality: Bilateral;   FRACTURE SURGERY     KNEE ARTHROSCOPY WITH MEDIAL COLLATERAL LIGAMENT RECONSTRUCTION Left 05/01/2021   Procedure: KNEE ARTHROSCOPY WITH MEDIAL COLLATERAL LIGAMENT REPAIR;  Surgeon: Hiram Gash, Mcdaniel;  Location: [facility];  Service: Orthopedics;  Laterality: Left;   KNEE ARTHROSCOPY WITH POSTERIOR CRUCIATE LIGAMENT (PCL) RECONSTRUCTION Left 05/01/2021   Procedure: KNEE ARTHROSCOPY WITH POSTERIOR CRUCIATE LIGAMENT (PCL) RECONSTRUCTION;  Surgeon: Hiram Gash, Mcdaniel;  Location: [place] [facility];  Service: Orthopedics;  Laterality: Left;   Patient Active Problem List   Diagnosis Date Noted   S/P cesarean section 04/07/2014   REFERRING PROVIDER: Hiram Gash, Mcdaniel   REFERRING DIAG: Left Knee Arthroscopy with PCL reconstruction and MCL repair on 05/01/2021    ONSET DATE: 12/07  THERAPY DIAG:  Left knee pain, unspecified chronicity  Local edema  Muscle weakness  Other abnormalities of gait and mobility  PERTINENT HISTORY: None   WEIGHT BEARING RESTRICTIONS WEIGHT BEARING RESTRICTIONS Yes TDWB with knee locked in ext at all times for 6 weeks (no resisted knee flexion or hyper ext for 6 months); follow multiligamentous protocol   SUBJECTIVE:   Pt reports that things are going well.  She continues to work on flexion at home and is surprised she does not have easier bending yet.  PAIN:  Are you having pain? No VAS scale: 0/10 Pain location: L knee    OBJECTIVE:      LE AROM/PROM:   A/PROM Right 05/08/2021 Left 05/08/2021 12/30 1/3 1/5  1/31 2/3 2/7  Hip flexion             Hip extension             Hip abduction             Hip adduction             Hip internal rotation  Hip external rotation             Knee flexion 110 55 67 80 85 90 97 100 103  Knee extension WNL Lacking 7 degrees Lacking 5 degrees   Lacking ~3 degrees ext Lacking 2 degrees    Ankle dorsiflexion             Ankle plantarflexion             Ankle inversion             Ankle eversion              (Blank rows = not tested)   LE MMT:   MMT Right 05/08/2021 Left 05/08/2021  Hip flexion      Knee extension      Knee flexion      Hip abduction      Hip extension      Hip external rotation      Hip internal rotation      Hip adduction      Ankle dorsiflexion      Ankle plantarflexion      Ankle inversion      Ankle eversion       (Blank rows = not tested; all scores listed out of a possible 5)        [facility] Adult PT Treatment:       Therapeutic  Exercise: Heelslides with strap x30 (not today) SLR 3x15 - 3 way - 3# LAQ with manual resistance: 3x10 SLS - 30'' bouts Blue rocker board - 2x20 (not today) In // Heel raise 2x20 Mini squat 2x10 Fwd walking Retro walking Fwd walking march Retro walking march Lateral walking      HOME EXERCISE PROGRAM: Access Code: [email or web address] URL: [email or web address] Date: 06/18/2021 Prepared by: [name]  Program Notes WEAR BRACE with standing exercises   Exercises Ice - 5 x daily - 7 x weekly - 1 sets - 1 reps - 20 min hold Long Sitting 4 Way Patellar Glide - 3 x daily - 7 x weekly - 3 sets - 10 reps Long Sitting Quad Set - 5 x daily - 7 x weekly - 3 sets - 10 reps Supine Heel Slide with Strap - 1 x daily - 7 x weekly - 3 sets - 10 reps Standing Hip Abduction with Counter Support - 1 x daily - 7 x weekly - 3 sets - 10 reps Standing Hip Extension with Counter Support - 1 x daily - 7 x weekly - 3 sets - 10 reps Heel Raises with Counter Support - 1 x daily - 7 x weekly - 3 sets - 10 reps        ASSESSMENT:  Livi is progressing well with therapy.  Pt reports no increase in baseline pain following therapy.  Today we concentrated on quad strengthening, hip strengthening, and normalizing gait.  Pt doing well with transition to normal gait.  She is able to maintain adequate stability in stance during walking march.  She requires cuing with mini squat as well has emphasizing knee flexion in terminal stance.  Brace unlocked to 60 degrees today.  Pt will continue to benefit from skilled physical therapy to address remaining deficits and achieve listed goals.  Continue per POC.   GOALS: Goals reviewed with patient? Yes   SHORT TERM GOALS:   STG Name Target Date Goal status  1 [name] will be >75% HEP compliant to improve carryover between sessions and facilitate independent management of condition  Baseline: No HEP 05/29/2021 MET 1/3    LONG TERM GOALS:    LTG Name  Target Date Goal status  1 LEFS 07/31/21 INITIAL  2 [name] will achieve 3 degrees knee extension by D/C (see POC end date) to improve stability in stance phase of gait    Baseline: lacking 7 degrees  2/10: MET 07/31/21 MET  3 [name] will achieve 110 degrees knee flexion by D/C (see POC end date) to improve ability to safely navigate steps in the community   Baseline: 55 degrees 07/31/21 INITIAL  4  Lexandra will be able to stand for >30'' in SLS stance, to show a significant improvement in balance in order to reduce fall risk    Baseline: unable 07/31/21 INITIAL  5 Cassandre will improve the following MMTs to >/= 4+/5 to show improvement in strength:  knee ext, hip abd, hip ext    Baseline: unable to test d/t precautions 07/31/21 INITIAL  6 Gait speed 07/31/21 INITIAL    PLAN: PT FREQUENCY: 1-2x/week   PT DURATION: 12 weeks (Ending 07/31/21)   PLANNED INTERVENTIONS: Therapeutic exercises, Therapeutic activity, Neuro Muscular re-education, Gait training, Patient/Family education, Joint mobilization, Dry Needling, Electrical stimulation, Spinal mobilization and/or manipulation, Moist heat, Taping, Vasopneumatic device, Ionotophoresis 47m/ml Dexamethasone, and Manual therapy   PLAN FOR NEXT SESSION: progress per protocol, take gait and LEFS for goals, see what Mcdaniel said about crutches vs walker vs no AD due to pt fear of using crutches, reassess her technique for patella mobs     KKevan NyReinhartsen PT 07/12/2021, 11:49 AM

## 2021-07-16 ENCOUNTER — Ambulatory Visit: Payer: Medicaid Other | Admitting: Physical Therapy

## 2021-07-16 ENCOUNTER — Other Ambulatory Visit: Payer: Self-pay

## 2021-07-16 ENCOUNTER — Encounter: Payer: Self-pay | Admitting: Physical Therapy

## 2021-07-16 DIAGNOSIS — M6281 Muscle weakness (generalized): Secondary | ICD-10-CM

## 2021-07-16 DIAGNOSIS — M25562 Pain in left knee: Secondary | ICD-10-CM

## 2021-07-16 DIAGNOSIS — R600 Localized edema: Secondary | ICD-10-CM

## 2021-07-16 NOTE — Therapy (Signed)
OUTPATIENT PHYSICAL THERAPY TREATMENT NOTE   Patient Name: Jill Mcdaniel MRN: [id number] DOB:[birth year], 46 y.o., female Today's Date: 07/16/2021  PCP: Merryl Hacker, No REFERRING PROVIDER: Hiram Gash, MD   PT End of Session - 07/16/21 1048     Visit Number 14    Number of Visits 24    Date for PT Re-Evaluation 07/31/21    Authorization Type [employer] MCD    Authorization - Number of Visits 27    PT Start Time 1046    PT Stop Time 1128    PT Time Calculation (min) 42 min    Equipment Utilized During Treatment Left knee immobilizer    Activity Tolerance Patient tolerated treatment well;No increased pain    Behavior During Therapy WFL for tasks assessed/performed                 Past Medical History:  Diagnosis Date   Anemia    Blood transfusion without reported diagnosis 1997   anemia   Seropositive for herpes simplex 2 infection    Vaginal Pap smear, abnormal    Past Surgical History:  Procedure Laterality Date   Abortions     CERVICAL CONIZATION W/BX N/A 03/22/2021   Procedure: COLD KNIFE CONIZATION CERVIX WITH BIOPSY;  Surgeon: Drema Dallas, DO;  Location: [facility];  Service: Gynecology;  Laterality: N/A;   CESAREAN SECTION     CESAREAN SECTION WITH BILATERAL TUBAL LIGATION Bilateral 04/07/2014   Procedure: CESAREAN SECTION WITH BILATERAL TUBAL LIGATION;  Surgeon: Maeola Sarah. Landry Mellow, MD;  Location: [facility];  Service: Obstetrics;  Laterality: Bilateral;   FRACTURE SURGERY     KNEE ARTHROSCOPY WITH MEDIAL COLLATERAL LIGAMENT RECONSTRUCTION Left 05/01/2021   Procedure: KNEE ARTHROSCOPY WITH MEDIAL COLLATERAL LIGAMENT REPAIR;  Surgeon: Hiram Gash, MD;  Location: [place];  Service: Orthopedics;  Laterality: Left;   KNEE ARTHROSCOPY WITH POSTERIOR CRUCIATE LIGAMENT (PCL) RECONSTRUCTION Left 05/01/2021   Procedure: KNEE ARTHROSCOPY WITH POSTERIOR CRUCIATE LIGAMENT (PCL) RECONSTRUCTION;  Surgeon: Hiram Gash, MD;  Location: [place];  Service: Orthopedics;  Laterality: Left;   Patient Active Problem List   Diagnosis Date Noted   S/P cesarean section 04/07/2014   REFERRING PROVIDER: Hiram Gash, MD   REFERRING DIAG: Left Knee Arthroscopy with PCL reconstruction and MCL repair on 05/01/2021    ONSET DATE: 12/07  THERAPY DIAG:  Left knee pain, unspecified chronicity  Local edema  Muscle weakness  PERTINENT HISTORY: None   WEIGHT BEARING RESTRICTIONS WEIGHT BEARING RESTRICTIONS Yes TDWB with knee locked in ext at all times for 6 weeks (no resisted knee flexion or hyper ext for 6 months); follow multiligamentous protocol   SUBJECTIVE:   Pt reports things are going well.  She feels her knee is loosening up.  PAIN:  Are you having pain? No VAS scale: 0/10 Pain location: L knee    OBJECTIVE:      LE AROM/PROM:   A/PROM Right 05/08/2021 Left 05/08/2021 12/30 1/3 1/5  1/31 2/3 2/7   Hip flexion              Hip extension              Hip abduction              Hip adduction              Hip internal rotation              Hip external rotation  Knee flexion '110 55 67 80 85 90 97 100 103 107 '  Knee extension WNL Lacking 7 degrees Lacking 5 degrees   Lacking ~3 degrees ext Lacking 2 degrees     Ankle dorsiflexion              Ankle plantarflexion              Ankle inversion              Ankle eversion               (Blank rows = not tested)   LE MMT:   MMT Right 05/08/2021 Left 05/08/2021  Hip flexion      Knee extension      Knee flexion      Hip abduction      Hip extension      Hip external rotation      Hip internal rotation      Hip adduction      Ankle dorsiflexion      Ankle plantarflexion      Ankle inversion      Ankle eversion       (Blank rows = not tested; all scores listed out of a possible 5)        [facility] Adult PT Treatment:       Therapeutic Exercise: Heelslides with strap x20 SLR 3x15 - 3 way - 4# LAQ with RTB: 3x10 SLS - 45'' bouts  x4 Tandem on foam - 45'' x3 (EC) Wall squat 3x10 - ~45 degrees In // Heel raise 2x20 Fwd walking march Retro walking march Hurdle walking  Lateral hurdle walking      HOME EXERCISE PROGRAM: Access Code: [email or web address] URL: [email or web address] Date: 06/18/2021 Prepared by: [name]  Program Notes WEAR BRACE with standing exercises   Exercises Ice - 5 x daily - 7 x weekly - 1 sets - 1 reps - 20 min hold Long Sitting 4 Way Patellar Glide - 3 x daily - 7 x weekly - 3 sets - 10 reps Long Sitting Quad Set - 5 x daily - 7 x weekly - 3 sets - 10 reps Supine Heel Slide with Strap - 1 x daily - 7 x weekly - 3 sets - 10 reps Standing Hip Abduction with Counter Support - 1 x daily - 7 x weekly - 3 sets - 10 reps Standing Hip Extension with Counter Support - 1 x daily - 7 x weekly - 3 sets - 10 reps Heel Raises with Counter Support - 1 x daily - 7 x weekly - 3 sets - 10 reps        ASSESSMENT:  Jill Mcdaniel is progressing well with therapy.  Pt reports no increase in baseline pain following therapy.  Today we concentrated on quad strengthening, hip strengthening, normalizing gait, and balance/proprioception.  Pt doing well as we progress ROM in brace.  Brace opened to 90 degrees today to good effect.  R knee ROM progressing nicely.  Pt will continue to benefit from skilled physical therapy to address remaining deficits and achieve listed goals.  Continue per POC.   GOALS: Goals reviewed with patient? Yes   SHORT TERM GOALS:   STG Name Target Date Goal status  1 Jill Mcdaniel will be >75% HEP compliant to improve carryover between sessions and facilitate independent management of condition   Baseline: No HEP 05/29/2021 MET 1/3    LONG TERM GOALS:    LTG Name Target Date Goal status  1 LEFS  07/31/21 INITIAL  2 Jill Mcdaniel will achieve 3 degrees knee extension by D/C (see POC end date) to improve stability in stance phase of gait    Baseline: lacking 7 degrees  2/10: MET 07/31/21  MET  3 Jill Mcdaniel will achieve 110 degrees knee flexion by D/C (see POC end date) to improve ability to safely navigate steps in the community   Baseline: 55 degrees 07/31/21 INITIAL  4  Jill Mcdaniel will be able to stand for >30'' in SLS stance, to show a significant improvement in balance in order to reduce fall risk    Baseline: unable 07/31/21 INITIAL  5 Jill Mcdaniel will improve the following MMTs to >/= 4+/5 to show improvement in strength:  knee ext, hip abd, hip ext    Baseline: unable to test d/t precautions 07/31/21 INITIAL  6 Gait speed 07/31/21 INITIAL    PLAN: PT FREQUENCY: 1-2x/week   PT DURATION: 12 weeks (Ending 07/31/21)   PLANNED INTERVENTIONS: Therapeutic exercises, Therapeutic activity, Neuro Muscular re-education, Gait training, Patient/Family education, Joint mobilization, Dry Needling, Electrical stimulation, Spinal mobilization and/or manipulation, Moist heat, Taping, Vasopneumatic device, Ionotophoresis 75m/ml Dexamethasone, and Manual therapy   PLAN FOR NEXT SESSION: progress per protocol, take gait and LEFS for goals, see what MD said about crutches vs walker vs no AD due to pt fear of using crutches, reassess her technique for patella mobs     KKevan NyReinhartsen PT 07/16/2021, 10:48 AM

## 2021-07-19 ENCOUNTER — Encounter: Payer: Self-pay | Admitting: Physical Therapy

## 2021-07-19 ENCOUNTER — Other Ambulatory Visit: Payer: Self-pay

## 2021-07-19 ENCOUNTER — Ambulatory Visit: Payer: Medicaid Other | Admitting: Physical Therapy

## 2021-07-19 DIAGNOSIS — R600 Localized edema: Secondary | ICD-10-CM

## 2021-07-19 DIAGNOSIS — M25562 Pain in left knee: Secondary | ICD-10-CM | POA: Diagnosis not present

## 2021-07-19 DIAGNOSIS — M6281 Muscle weakness (generalized): Secondary | ICD-10-CM

## 2021-07-19 DIAGNOSIS — R2689 Other abnormalities of gait and mobility: Secondary | ICD-10-CM

## 2021-07-19 NOTE — Therapy (Signed)
OUTPATIENT PHYSICAL THERAPY TREATMENT NOTE   Patient Name: Jill Mcdaniel MRN: [id number] DOB:28-Nov-1975, 46 y.o., female Today's Date: 07/19/2021  PCP: Merryl Hacker, No REFERRING PROVIDER: Hiram Gash, MD   PT End of Session - 07/19/21 1049     Visit Number 15    Number of Visits 24    Date for PT Re-Evaluation 07/31/21    Authorization Type [employer] MCD    Authorization - Number of Visits 27    PT Start Time 1047    PT Stop Time 1128    PT Time Calculation (min) 41 min    Equipment Utilized During Treatment Left knee immobilizer    Activity Tolerance Patient tolerated treatment well;No increased pain    Behavior During Therapy WFL for tasks assessed/performed                  Past Medical History:  Diagnosis Date   Anemia    Blood transfusion without reported diagnosis 1997   anemia   Seropositive for herpes simplex 2 infection    Vaginal Pap smear, abnormal    Past Surgical History:  Procedure Laterality Date   Abortions     CERVICAL CONIZATION W/BX N/A 03/22/2021   Procedure: COLD KNIFE CONIZATION CERVIX WITH BIOPSY;  Surgeon: Drema Dallas, DO;  Location: [place];  Service: Gynecology;  Laterality: N/A;   CESAREAN SECTION     CESAREAN SECTION WITH BILATERAL TUBAL LIGATION Bilateral 04/07/2014   Procedure: CESAREAN SECTION WITH BILATERAL TUBAL LIGATION;  Surgeon: Maeola Sarah. Landry Mellow, MD;  Location: [facility];  Service: Obstetrics;  Laterality: Bilateral;   FRACTURE SURGERY     KNEE ARTHROSCOPY WITH MEDIAL COLLATERAL LIGAMENT RECONSTRUCTION Left 05/01/2021   Procedure: KNEE ARTHROSCOPY WITH MEDIAL COLLATERAL LIGAMENT REPAIR;  Surgeon: Hiram Gash, MD;  Location: [place];  Service: Orthopedics;  Laterality: Left;   KNEE ARTHROSCOPY WITH POSTERIOR CRUCIATE LIGAMENT (PCL) RECONSTRUCTION Left 05/01/2021   Procedure: KNEE ARTHROSCOPY WITH POSTERIOR CRUCIATE LIGAMENT (PCL) RECONSTRUCTION;  Surgeon: Hiram Gash, MD;  Location: [place];  Service: Orthopedics;  Laterality: Left;   Patient Active Problem List   Diagnosis Date Noted   S/P cesarean section 04/07/2014   REFERRING PROVIDER: Hiram Gash, MD   REFERRING DIAG: Left Knee Arthroscopy with PCL reconstruction and MCL repair on 05/01/2021    ONSET DATE: 12/07  THERAPY DIAG:  Left knee pain, unspecified chronicity  Local edema  Muscle weakness  Other abnormalities of gait and mobility  Balance problem  PERTINENT HISTORY: None   WEIGHT BEARING RESTRICTIONS WEIGHT BEARING RESTRICTIONS Yes TDWB with knee locked in ext at all times for 6 weeks (no resisted knee flexion or hyper ext for 6 months); follow multiligamentous protocol   SUBJECTIVE:   Pt reports things are going well.  She saw Dr. Griffin Basil who said things looked good.  PAIN:  Are you having pain? No VAS scale: 0/10 Pain location: L knee    OBJECTIVE:      LE AROM/PROM:   A/PROM Right 05/08/2021 Left 05/08/2021 12/30 1/3 1/5  1/31 2/3 2/7   Hip flexion              Hip extension              Hip abduction              Hip adduction              Hip internal rotation  Hip external rotation              Knee flexion _0  Knee extension WNL Lacking 7 degrees Lacking 5 degrees   Lacking ~3 degrees ext Lacking 2 degrees     Ankle dorsiflexion              Ankle plantarflexion              Ankle inversion              Ankle eversion               (Blank rows = not tested)   LE MMT:   MMT Right 05/08/2021 Left 05/08/2021  Hip flexion      Knee extension      Knee flexion      Hip abduction      Hip extension      Hip external rotation      Hip internal rotation      Hip adduction      Ankle dorsiflexion      Ankle plantarflexion      Ankle inversion      Ankle eversion       (Blank rows = not tested; all scores listed out of a possible 5)        [facility] Adult PT Treatment:       Therapeutic Exercise:  Bike 5 min L2 -  while taking subjective Knee ext L - 3x10 @ 10# Leg press L - 3x10 _1 # SLS - 45'' bouts on foam x3 Tandem walking fwd and retro (EC) Squats to blue chair + 2 airex 3x10 In // Heel raise 2x20 Fwd walking march Retro walking march Step up      HOME EXERCISE PROGRAM: Access Code: [email or web address] URL: [email or web address] Date: 07/19/2021 Prepared by: [name]  Program Notes WEAR BRACE with standing exercises   Exercises Long Sitting 4 Way Patellar Glide - 3 x daily - 7 x weekly - 3 sets - 10 reps Heel Raises with Counter Support - 1 x daily - 7 x weekly - 3 sets - 10 reps Mini Squat with Counter Support - 1 x daily - 7 x weekly - 3 sets - 10 reps Seated Knee Extension with Resistance - 1 x daily - 7 x weekly - 3 sets - 10 reps         ASSESSMENT:  Jill Mcdaniel is progressing well with therapy.  Pt reports no increase in baseline pain following therapy.  Today we concentrated on quad strengthening, hip strengthening, and balance/proprioception.  Pt with significant improvement in balance today.  She is now comfortable standing in SLS and we were able to progress to SLS on foam.  Beginning knee ext progression as well as squat progression to good effect.  Pt will continue to benefit from skilled physical therapy to address remaining deficits and achieve listed goals.  Continue per POC.   GOALS: Goals reviewed with patient? Yes   SHORT TERM GOALS:   STG Name Target Date Goal status  1 Jill Mcdaniel will be >75% HEP compliant to improve carryover between sessions and facilitate independent management of condition   Baseline: No HEP 05/29/2021 MET 1/3    LONG TERM GOALS:    LTG Name Target Date Goal status  1 LEFS 07/31/21 INITIAL  2 Jill Mcdaniel will achieve 3 degrees knee extension by D/C (see POC end date) to improve stability in stance phase of  gait    Baseline: lacking 7 degrees  2/10: MET 07/31/21 MET  3 Jill Mcdaniel will achieve 110 degrees knee flexion by D/C (see POC  end date) to improve ability to safely navigate steps in the community   Baseline: 55 degrees 07/31/21 INITIAL  4  Jill Mcdaniel will be able to stand for >30'' in SLS stance, to show a significant improvement in balance in order to reduce fall risk    Baseline: unable 07/31/21 INITIAL  5 Jill Mcdaniel will improve the following MMTs to >/= 4+/5 to show improvement in strength:  knee ext, hip abd, hip ext    Baseline: unable to test d/t precautions 07/31/21 INITIAL  6 Gait speed 07/31/21 INITIAL    PLAN: PT FREQUENCY: 1-2x/week   PT DURATION: 12 weeks (Ending 07/31/21)   PLANNED INTERVENTIONS: Therapeutic exercises, Therapeutic activity, Neuro Muscular re-education, Gait training, Patient/Family education, Joint mobilization, Dry Needling, Electrical stimulation, Spinal mobilization and/or manipulation, Moist heat, Taping, Vasopneumatic device, Ionotophoresis 71m/ml Dexamethasone, and Manual therapy   PLAN FOR NEXT SESSION: progress per protocol, take gait and LEFS for goals, see what MD said about crutches vs walker vs no AD due to pt fear of using crutches, reassess her technique for patella mobs     KKevan NyReinhartsen PT 07/19/2021, 11:29 AM

## 2021-07-24 ENCOUNTER — Other Ambulatory Visit: Payer: Self-pay

## 2021-07-24 ENCOUNTER — Encounter: Payer: Self-pay | Admitting: Physical Therapy

## 2021-07-24 ENCOUNTER — Ambulatory Visit: Payer: Medicaid Other | Attending: Orthopaedic Surgery | Admitting: Physical Therapy

## 2021-07-24 DIAGNOSIS — R600 Localized edema: Secondary | ICD-10-CM | POA: Insufficient documentation

## 2021-07-24 DIAGNOSIS — R2689 Other abnormalities of gait and mobility: Secondary | ICD-10-CM | POA: Insufficient documentation

## 2021-07-24 DIAGNOSIS — M25562 Pain in left knee: Secondary | ICD-10-CM | POA: Diagnosis not present

## 2021-07-24 DIAGNOSIS — M6281 Muscle weakness (generalized): Secondary | ICD-10-CM | POA: Insufficient documentation

## 2021-07-24 NOTE — Therapy (Signed)
?OUTPATIENT PHYSICAL THERAPY TREATMENT NOTE ? ? ?Patient Name: Jill Mcdaniel ?MRN: [id number] ?DOB:1976/01/05, 46 y.o., female ?Today's Date: 07/24/2021 ? ?PCP: Pcp, No ?REFERRING PROVIDER: Hiram Gash, MD ? ? PT End of Session - 07/24/21 1130   ? ? Visit Number 16   ? Number of Visits 24   ? Date for PT Re-Evaluation 07/31/21   ? Authorization Type U[employer] MCD   ? Authorization - Number of Visits 27   ? PT Start Time 1130   ? PT Stop Time 6010   ? PT Time Calculation (min) 44 min   ? Equipment Utilized During Treatment Left knee immobilizer   ? Activity Tolerance Patient tolerated treatment well;No increased pain   ? Behavior During Therapy [facility] for tasks assessed/performed   ? ?  ?  ? ?  ? ? ? ? ? ? ? ?Past Medical History:  ?Diagnosis Date  ? Anemia   ? Blood transfusion without reported diagnosis 1997  ? anemia  ? Seropositive for herpes simplex 2 infection   ? Vaginal Pap smear, abnormal   ? ?Past Surgical History:  ?Procedure Laterality Date  ? Abortions    ? CERVICAL CONIZATION W/BX N/A 03/22/2021  ? Procedure: COLD KNIFE CONIZATION CERVIX WITH BIOPSY;  Surgeon: Drema Dallas, DO;  Location: [place];  Service: Gynecology;  Laterality: N/A;  ? CESAREAN SECTION    ? CESAREAN SECTION WITH BILATERAL TUBAL LIGATION Bilateral 04/07/2014  ? Procedure: CESAREAN SECTION WITH BILATERAL TUBAL LIGATION;  Surgeon: Maeola Sarah. Landry Mellow, MD;  Location: [facility];  Service: Obstetrics;  Laterality: Bilateral;  ? FRACTURE SURGERY    ? KNEE ARTHROSCOPY WITH MEDIAL COLLATERAL LIGAMENT RECONSTRUCTION Left 05/01/2021  ? Procedure: KNEE ARTHROSCOPY WITH MEDIAL COLLATERAL LIGAMENT REPAIR;  Surgeon: Hiram Gash, MD;  Location: [place];  Service: Orthopedics;  Laterality: Left;  ? KNEE ARTHROSCOPY WITH POSTERIOR CRUCIATE LIGAMENT (PCL) RECONSTRUCTION Left 05/01/2021  ? Procedure: KNEE ARTHROSCOPY WITH POSTERIOR CRUCIATE LIGAMENT (PCL) RECONSTRUCTION;  Surgeon: Hiram Gash, MD;  Location: [place];  Service: Orthopedics;  Laterality: Left;  ? ?Patient Active Problem List  ? Diagnosis Date Noted  ? S/P cesarean section 04/07/2014  ? ?REFERRING PROVIDER: Hiram Gash, MD ?  ?REFERRING DIAG: Left Knee Arthroscopy with PCL reconstruction and MCL repair on 05/01/2021 ?   ?ONSET DATE: 12/07 ? ?THERAPY DIAG:  ?Left knee pain, unspecified chronicity ? ?Local edema ? ?Muscle weakness ? ?PERTINENT HISTORY: None ?  ?WEIGHT BEARING RESTRICTIONS WEIGHT BEARING RESTRICTIONS Yes TDWB with knee locked in ext at all times for 6 weeks (no resisted knee flexion or hyper ext for 6 months); follow multiligamentous protocol  ? ?SUBJECTIVE:  ? ?Pt reports things are going well overall.  She was tired after last visit, but is feeling good. ? ?PAIN:  ?Are you having pain? No ?VAS scale: 0/10 ?Pain location: L knee ? ? ? ?OBJECTIVE:  ?  ?  ?LE AROM/PROM: ?  ?A/PROM Right ?05/08/2021 Left ?05/08/2021 12/30 1/3 1/5  1/31 2/3 2/7   ?Hip flexion              ?Hip extension              ?Hip abduction              ?Hip adduction              ?Hip internal rotation              ?  Hip external rotation              ?Knee flexion '110 55 67 80 85 90 97 100 103 107 '  ?Knee extension WNL Lacking 7 degrees Lacking 5 degrees   Lacking ~3 degrees ext Lacking 2 degrees     ?Ankle dorsiflexion              ?Ankle plantarflexion              ?Ankle inversion              ?Ankle eversion              ? (Blank rows = not tested) ?  ?LE MMT: ?  ?MMT Right ?05/08/2021 Left ?05/08/2021  ?Hip flexion      ?Knee extension      ?Knee flexion      ?Hip abduction      ?Hip extension      ?Hip external rotation      ?Hip internal rotation      ?Hip adduction      ?Ankle dorsiflexion      ?Ankle plantarflexion      ?Ankle inversion      ?Ankle eversion      ? (Blank rows = not tested; all scores listed out of a possible 5) ?  ?  ? ?  ?[facility] Adult PT Treatment:      ? ?Therapeutic Exercise: ? ?Bike 5 min L5 - while taking subjective ?Knee ext L - 3x10 @  10# ?Leg press L - 3x10 '@45' # ?Squats to blue chair + 2 airex 3x10 ?Step up fwd and lat 2x10  ?RDL (L) - 2x10 5# ? ?NRE ?SLS - 45'' bouts on foam x3 ?Tandem w/ red ball rebounder throw 30x ?Tandem SLS with rbounder throw red ball 3x10  ?Tandem walking fwd and retro (EC) 2 laps ?    ? ?HOME EXERCISE PROGRAM: ?Access Code: [email or web address] ?URL: [email or web address] ?Date: 07/19/2021 ?Prepared by: [name] ? ?Program Notes ?WEAR BRACE with standing exercises ? ? ?Exercises ?Long Sitting 4 Way Patellar Glide - 3 x daily - 7 x weekly - 3 sets - 10 reps ?Heel Raises with Counter Support - 1 x daily - 7 x weekly - 3 sets - 10 reps ?Mini Squat with Counter Support - 1 x daily - 7 x weekly - 3 sets - 10 reps ?Seated Knee Extension with Resistance - 1 x daily - 7 x weekly - 3 sets - 10 reps ? ? ? ? ?  ?  ?ASSESSMENT: ? ?[place] is progressing well with therapy.  Pt reports no increase in baseline pain following therapy.  Today we concentrated on quad strengthening, hip strengthening, and balance/proprioception.  Pt continues to progress as expected.  Her balance generally is quite good.  She does have significant calf fatigue with balance exercises.  RDL is challenging and requires cues for form.  We will continue to work on this.  Pt will continue to benefit from skilled physical therapy to address remaining deficits and achieve listed goals.  Continue per POC. ?  ?GOALS: ?Goals reviewed with patient? Yes ?  ?SHORT TERM GOALS: ?  ?STG Name Target Date Goal status  ?1 [place] will be >75% HEP compliant to improve carryover between sessions and facilitate independent management of condition ?  ?Baseline: No HEP 05/29/2021 MET 1/3  ?  ?LONG TERM GOALS:  ?  ?LTG Name Target Date Goal status  ?1 LEFS 07/31/21 INITIAL  ?  2 [place] will achieve 3 degrees knee extension by D/C (see POC end date) to improve stability in stance phase of gait  ?  ?Baseline: lacking 7 degrees ? ?2/10: MET 07/31/21 MET  ?3 [place] will achieve  110 degrees knee flexion by D/C (see POC end date) to improve ability to safely navigate steps in the community ?  ?Baseline: 55 degrees 07/31/21 INITIAL  ?[place] will be able to stand for >30'' in SLS stance, to show a significant improvement in balance in order to reduce fall risk  ?  ?Baseline: unable 07/31/21 INITIAL  ?5 [place] will improve the following MMTs to >/= 4+/5 to show improvement in strength:  knee ext, hip abd, hip ext  ?  ?Baseline: unable to test d/t precautions 07/31/21 INITIAL  ?6 Gait speed 07/31/21 INITIAL  ?  ?PLAN: ?PT FREQUENCY: 1-2x/week ?  ?PT DURATION: 12 weeks (Ending 07/31/21) ?  ?PLANNED INTERVENTIONS: Therapeutic exercises, Therapeutic activity, Neuro Muscular re-education, Gait training, Patient/Family education, Joint mobilization, Dry Needling, Electrical stimulation, Spinal mobilization and/or manipulation, Moist heat, Taping, Vasopneumatic device, Ionotophoresis 41m/ml Dexamethasone, and Manual therapy ?  ?PLAN FOR NEXT SESSION: progress per protocol, take gait and LEFS for goals, see what MD said about crutches vs walker vs no AD due to pt fear of using crutches, reassess her technique for patella mobs ? ? ? ? ?KMathis DadPT ?07/24/2021, 11:30 AM ? ?   ?

## 2021-07-26 ENCOUNTER — Ambulatory Visit: Payer: Medicaid Other | Admitting: Physical Therapy

## 2021-07-30 ENCOUNTER — Encounter: Payer: Self-pay | Admitting: Physical Therapy

## 2021-07-31 ENCOUNTER — Ambulatory Visit: Payer: Medicaid Other | Admitting: Physical Therapy

## 2021-08-02 ENCOUNTER — Ambulatory Visit: Payer: Medicaid Other | Admitting: Physical Therapy

## 2021-08-02 ENCOUNTER — Other Ambulatory Visit: Payer: Self-pay

## 2021-08-02 ENCOUNTER — Encounter: Payer: Self-pay | Admitting: Physical Therapy

## 2021-08-02 DIAGNOSIS — M25562 Pain in left knee: Secondary | ICD-10-CM

## 2021-08-02 DIAGNOSIS — R600 Localized edema: Secondary | ICD-10-CM

## 2021-08-02 DIAGNOSIS — M6281 Muscle weakness (generalized): Secondary | ICD-10-CM

## 2021-08-02 NOTE — Therapy (Signed)
?OUTPATIENT PHYSICAL THERAPY TREATMENT NOTE ? ? ?Patient Name: Jill Mcdaniel ?MRN: [id number] ?DOB:16-Apr-1976, 46 y.o., female ?Today's Date: 08/02/2021 ? ?PCP: Pcp, No ?REFERRING PROVIDER: Hiram Gash, MD ? ? PT End of Session - 08/02/21 1001   ? ? Visit Number 17   ? Number of Visits 24   ? Date for PT Re-Evaluation 09/02/21   extended  ? Authorization Type [employer] MCD   ? Authorization - Number of Visits 27   ? PT Start Time 1000   ? PT Stop Time 1478   ? PT Time Calculation (min) 42 min   ? Equipment Utilized During Treatment Left knee immobilizer   ? Activity Tolerance Patient tolerated treatment well;No increased pain   ? Behavior During Therapy [facility] for tasks assessed/performed   ? ?  ?  ? ?  ? ? ? ? ? ? ? ?Past Medical History:  ?Diagnosis Date  ? Anemia   ? Blood transfusion without reported diagnosis 1997  ? anemia  ? Seropositive for herpes simplex 2 infection   ? Vaginal Pap smear, abnormal   ? ?Past Surgical History:  ?Procedure Laterality Date  ? Abortions    ? CERVICAL CONIZATION W/BX N/A 03/22/2021  ? Procedure: COLD KNIFE CONIZATION CERVIX WITH BIOPSY;  Surgeon: Drema Dallas, DO;  Location: [place];  Service: Gynecology;  Laterality: N/A;  ? CESAREAN SECTION    ? CESAREAN SECTION WITH BILATERAL TUBAL LIGATION Bilateral 04/07/2014  ? Procedure: CESAREAN SECTION WITH BILATERAL TUBAL LIGATION;  Surgeon: Maeola Sarah. Landry Mellow, MD;  Location: [facility];  Service: Obstetrics;  Laterality: Bilateral;  ? FRACTURE SURGERY    ? KNEE ARTHROSCOPY WITH MEDIAL COLLATERAL LIGAMENT RECONSTRUCTION Left 05/01/2021  ? Procedure: KNEE ARTHROSCOPY WITH MEDIAL COLLATERAL LIGAMENT REPAIR;  Surgeon: Hiram Gash, MD;  Location: [place];  Service: Orthopedics;  Laterality: Left;  ? KNEE ARTHROSCOPY WITH POSTERIOR CRUCIATE LIGAMENT (PCL) RECONSTRUCTION Left 05/01/2021  ? Procedure: KNEE ARTHROSCOPY WITH POSTERIOR CRUCIATE LIGAMENT (PCL) RECONSTRUCTION;  Surgeon: Hiram Gash, MD;  Location: [place];  Service: Orthopedics;  Laterality: Left;  ? ?Patient Active Problem List  ? Diagnosis Date Noted  ? S/P cesarean section 04/07/2014  ? ?REFERRING PROVIDER: Hiram Gash, MD ?  ?REFERRING DIAG: Left Knee Arthroscopy with PCL reconstruction and MCL repair on 05/01/2021 ?   ?ONSET DATE: 12/07 ? ?THERAPY DIAG:  ?Left knee pain, unspecified chronicity ? ?Local edema ? ?Muscle weakness ? ?PERTINENT HISTORY: None ?  ?WEIGHT BEARING RESTRICTIONS WEIGHT BEARING RESTRICTIONS Yes TDWB with knee locked in ext at all times for 6 weeks (no resisted knee flexion or hyper ext for 6 months); follow multiligamentous protocol  ? ?SUBJECTIVE:  ? ?Pt reports overall she is feeling good. She wonders if she is able to bend down (kneel). ? ?PAIN:  ?Are you having pain? No ?VAS scale: 0/10 ?Pain location: L knee ? ? ? ?OBJECTIVE:  ?  ?  ?LE AROM/PROM: ?  ?A/PROM Right ?05/08/2021 Left ?05/08/2021 12/30 1/3 1/5  1/31 2/3 2/7   ?Hip flexion              ?Hip extension              ?Hip abduction              ?Hip adduction              ?Hip internal rotation              ?  Hip external rotation              ?Knee flexion '110 55 67 80 85 90 97 100 103 107 '  ?Knee extension WNL Lacking 7 degrees Lacking 5 degrees   Lacking ~3 degrees ext Lacking 2 degrees     ?Ankle dorsiflexion              ?Ankle plantarflexion              ?Ankle inversion              ?Ankle eversion              ? (Blank rows = not tested) ?  ?LE MMT: ?  ?MMT Right ?3/10 Left ?3/10  ?Hip flexion      ?Knee extension  5  4+  ?Knee flexion      ?Hip abduction   5  ?Hip extension   4   ?Hip external rotation      ?Hip internal rotation      ?Hip adduction      ?Ankle dorsiflexion      ?Ankle plantarflexion      ?Ankle inversion      ?Ankle eversion      ? (Blank rows = not tested; all scores listed out of a possible 5) ?  ?  ? ?  ?[place] Adult PT Treatment:      ? ?Therapeutic Exercise: ? ?Bike 5 min L5 - while taking subjective ?Knee ext L - 3x10 @ 5# ?Leg  press L - 3x10 '@45' # ?Step down 4'' step - 3x10 ?Squats to blue chair + 2 airex 3x10 with mirror for VC ?Step up fwd and lat 2x10 (NT) ?RDL (L) - 2x10 5# ?Split squat - 3x5 ? ?Therapeutic Activity ?- collecting information for goals, checking progress, and reviewing with patient ? ?    ? ?HOME EXERCISE PROGRAM: ?Access Code: [email or web address] ?URL: [email or web address] ?Date: 07/19/2021 ?Prepared by: [name] ? ?Program Notes ?WEAR BRACE with standing exercises ? ? ?Exercises ?Long Sitting 4 Way Patellar Glide - 3 x daily - 7 x weekly - 3 sets - 10 reps ?Heel Raises with Counter Support - 1 x daily - 7 x weekly - 3 sets - 10 reps ?Mini Squat with Counter Support - 1 x daily - 7 x weekly - 3 sets - 10 reps ?Seated Knee Extension with Resistance - 1 x daily - 7 x weekly - 3 sets - 10 reps ? ?  ?  ?ASSESSMENT: ? ?Jill Mcdaniel is progressing well with therapy.  Pt reports no increase in baseline pain following therapy.  Today we concentrated on quad strengthening and hip strengthening.  Pt with R lateral lead with squatting activities which improves with verbal and visual cues.  She demonstrates general strength deficit in bil LE but there is a motor pattern favoring R LE during squats which improves with cuing.  Pt will continue to benefit from skilled physical therapy to address remaining deficits and achieve listed goals.  Extending POC to allow for continuation of advanced strengthening and possibly plymometrics. ?  ?GOALS: ?Goals reviewed with patient? Yes ?  ?SHORT TERM GOALS: ?  ?STG Name Target Date Goal status  ?1 Jill Mcdaniel will be >75% HEP compliant to improve carryover between sessions and facilitate independent management of condition ?  ?Baseline: No HEP 05/29/2021 MET 1/3  ?  ?LONG TERM GOALS:  ?  ?LTG Name Target Date Goal status  ?1 LEFS 09/02/21 (updated) INITIAL  ?  2 Jill Mcdaniel will achieve 3 degrees knee extension by D/C (see POC end date) to improve stability in stance phase of gait  ?  ?Baseline:  lacking 7 degrees ? ?2/10: MET 09/02/21 (updated) MET  ?3 Jill Mcdaniel will achieve 110 degrees knee flexion by D/C (see POC end date) to improve ability to safely navigate steps in the community ?  ?Baseline: 55 degrees ? ?3/10: 110 09/02/21 (updated) MET  ?[place] will be able to stand for >30'' in SLS stance, to show a significant improvement in balance in order to reduce fall risk  ?  ?Baseline: unable ? ?3/10: MET 09/02/21 (updated) MET  ?5 [place] will improve the following MMTs to >/= 4+/5 to show improvement in strength:  knee ext, hip abd, hip ext  ?  ?Baseline: unable to test d/t precautions ? ?3/10: see chart 09/02/21 (updated) ongoing  ?  ?PLAN: ?PT FREQUENCY: 1-2x/week ?  ?PT DURATION:  (Ending 09/02/21) ?  ?PLANNED INTERVENTIONS: Therapeutic exercises, Therapeutic activity, Neuro Muscular re-education, Gait training, Patient/Family education, Joint mobilization, Dry Needling, Electrical stimulation, Spinal mobilization and/or manipulation, Moist heat, Taping, Vasopneumatic device, Ionotophoresis 5m/ml Dexamethasone, and Manual therapy ?  ?PLAN FOR NEXT SESSION: progress per protocol, take gait and LEFS for goals, see what MD said about crutches vs walker vs no AD due to pt fear of using crutches, reassess her technique for patella mobs ? ? ? ? ?KMathis DadPT ?08/02/2021, 10:54 AM ? ?   ?

## 2021-08-07 ENCOUNTER — Other Ambulatory Visit: Payer: Self-pay

## 2021-08-07 ENCOUNTER — Encounter: Payer: Self-pay | Admitting: Physical Therapy

## 2021-08-07 ENCOUNTER — Ambulatory Visit: Payer: Medicaid Other | Admitting: Physical Therapy

## 2021-08-07 DIAGNOSIS — M6281 Muscle weakness (generalized): Secondary | ICD-10-CM

## 2021-08-07 DIAGNOSIS — M25562 Pain in left knee: Secondary | ICD-10-CM | POA: Diagnosis not present

## 2021-08-07 DIAGNOSIS — R600 Localized edema: Secondary | ICD-10-CM

## 2021-08-07 DIAGNOSIS — R2689 Other abnormalities of gait and mobility: Secondary | ICD-10-CM

## 2021-08-07 NOTE — Therapy (Signed)
?OUTPATIENT PHYSICAL THERAPY TREATMENT NOTE ? ? ?Patient Name: Jill Mcdaniel ?MRN: [id number] ?DOB:Oct 25, 1975, 46 y.o., female ?Today's Date: 08/07/2021 ? ?PCP: Pcp, No ?REFERRING PROVIDER: Hiram Gash, MD ? ? PT End of Session - 08/07/21 1015   ? ? Visit Number 18   ? Number of Visits 24   ? Date for PT Re-Evaluation 09/02/21   extended  ? Authorization Type UHC MCD   ? Authorization - Number of Visits 27   ? PT Start Time 6967   pt unable to find parking, started late  ? PT Stop Time 8938   ? PT Time Calculation (min) 27 min   ? Equipment Utilized During Treatment Left knee immobilizer   ? Activity Tolerance Patient tolerated treatment well;No increased pain   ? Behavior During Therapy [facility] for tasks assessed/performed   ? ?  ?  ? ?  ? ? ? ? ? ? ? ? ?Past Medical History:  ?Diagnosis Date  ? Anemia   ? Blood transfusion without reported diagnosis 1997  ? anemia  ? Seropositive for herpes simplex 2 infection   ? Vaginal Pap smear, abnormal   ? ?Past Surgical History:  ?Procedure Laterality Date  ? Abortions    ? CERVICAL CONIZATION W/BX N/A 03/22/2021  ? Procedure: COLD KNIFE CONIZATION CERVIX WITH BIOPSY;  Surgeon: Drema Dallas, DO;  Location: [place];  Service: Gynecology;  Laterality: N/A;  ? CESAREAN SECTION    ? CESAREAN SECTION WITH BILATERAL TUBAL LIGATION Bilateral 04/07/2014  ? Procedure: CESAREAN SECTION WITH BILATERAL TUBAL LIGATION;  Surgeon: Maeola Sarah. Landry Mellow, MD;  Location: [place] [facility];  Service: Obstetrics;  Laterality: Bilateral;  ? FRACTURE SURGERY    ? KNEE ARTHROSCOPY WITH MEDIAL COLLATERAL LIGAMENT RECONSTRUCTION Left 05/01/2021  ? Procedure: KNEE ARTHROSCOPY WITH MEDIAL COLLATERAL LIGAMENT REPAIR;  Surgeon: Hiram Gash, MD;  Location: [place];  Service: Orthopedics;  Laterality: Left;  ? KNEE ARTHROSCOPY WITH POSTERIOR CRUCIATE LIGAMENT (PCL) RECONSTRUCTION Left 05/01/2021  ? Procedure: KNEE ARTHROSCOPY WITH POSTERIOR CRUCIATE LIGAMENT (PCL) RECONSTRUCTION;   Surgeon: Hiram Gash, MD;  Location: [place];  Service: Orthopedics;  Laterality: Left;  ? ?Patient Active Problem List  ? Diagnosis Date Noted  ? S/P cesarean section 04/07/2014  ? ?REFERRING PROVIDER: Hiram Gash, MD ?  ?REFERRING DIAG: Left Knee Arthroscopy with PCL reconstruction and MCL repair on 05/01/2021 ?   ?ONSET DATE: 12/07 ? ?THERAPY DIAG:  ?Left knee pain, unspecified chronicity ? ?Local edema ? ?Muscle weakness ? ?Other abnormalities of gait and mobility ? ?PERTINENT HISTORY: None ?  ?WEIGHT BEARING RESTRICTIONS WEIGHT BEARING RESTRICTIONS Yes TDWB with knee locked in ext at all times for 6 weeks (no resisted knee flexion or hyper ext for 6 months); follow multiligamentous protocol  ? ?SUBJECTIVE:  ? ?Pt reports that she is doing well overall. ? ?PAIN:  ?Are you having pain? No ?VAS scale: 0/10 ?Pain location: L knee ? ? ? ?OBJECTIVE:  ?  ?  ?LE AROM/PROM: ?  ?A/PROM Right ?05/08/2021 Left ?05/08/2021 12/30 1/3 1/5  1/31 2/3 2/7   ?Hip flexion              ?Hip extension              ?Hip abduction              ?Hip adduction              ?Hip internal rotation              ?  Hip external rotation              ?Knee flexion '110 55 67 80 85 90 97 100 103 107 '  ?Knee extension WNL Lacking 7 degrees Lacking 5 degrees   Lacking ~3 degrees ext Lacking 2 degrees     ?Ankle dorsiflexion              ?Ankle plantarflexion              ?Ankle inversion              ?Ankle eversion              ? (Blank rows = not tested) ?  ?LE MMT: ?  ?MMT Right ?3/10 Left ?3/10  ?Hip flexion      ?Knee extension  5  4+  ?Knee flexion      ?Hip abduction   5  ?Hip extension   4   ?Hip external rotation      ?Hip internal rotation      ?Hip adduction      ?Ankle dorsiflexion      ?Ankle plantarflexion      ?Ankle inversion      ?Ankle eversion      ? (Blank rows = not tested; all scores listed out of a possible 5) ?  ?  ? ?  ?[place] Adult PT Treatment:      ? ?Therapeutic Exercise: ? ?Bike 3 min L5 - while  taking subjective ?Leg press L - 3x10 '@45' # (NT) ?Deep squat with UE support - 2x20 ?S/L wall squat 2x10  ?Step down 4'' step - 3x10 ?Lateral walking with GTB at ankles ?Step up with march. 8'' - 3x10 ?Squats to blue chair + 1 airex 2x10 with mirror for VC ?RDL (L) - 2x10 5# ?Split squat - 3x5 ?    ? ?HOME EXERCISE PROGRAM: ?Access Code: [email or web address] ?URL: [email or web address] ?Date: 07/19/2021 ?Prepared by: [name] ? ?Program Notes ?WEAR BRACE with standing exercises ? ? ?Exercises ?Long Sitting 4 Way Patellar Glide - 3 x daily - 7 x weekly - 3 sets - 10 reps ?Heel Raises with Counter Support - 1 x daily - 7 x weekly - 3 sets - 10 reps ?Mini Squat with Counter Support - 1 x daily - 7 x weekly - 3 sets - 10 reps ?Seated Knee Extension with Resistance - 1 x daily - 7 x weekly - 3 sets - 10 reps ? ?  ?  ?ASSESSMENT: ? ?Jill Mcdaniel is progressing well with therapy.  Pt reports no increase in baseline pain following therapy.  Today we concentrated on quad strengthening and hip strengthening.  Pt with improved quad control and more even weight distribution with squat (although this still requires some minor cuing).  Pt will continue to benefit from skilled physical therapy to address remaining deficits and achieve listed goals.  Continue per POC. ?  ?GOALS: ?Goals reviewed with patient? Yes ?  ?SHORT TERM GOALS: ?  ?STG Name Target Date Goal status  ?1 Jill Mcdaniel will be >75% HEP compliant to improve carryover between sessions and facilitate independent management of condition ?  ?Baseline: No HEP 05/29/2021 MET 1/3  ?  ?LONG TERM GOALS:  ?  ?LTG Name Target Date Goal status  ?1 LEFS 09/02/21 (updated) INITIAL  ?2 Jill Mcdaniel will achieve 3 degrees knee extension by D/C (see POC end date) to improve stability in stance phase of gait  ?  ?Baseline: lacking 7 degrees ? ?2/10: MET  09/02/21 (updated) MET  ?3 Jill Mcdaniel will achieve 110 degrees knee flexion by D/C (see POC end date) to improve ability to safely navigate  steps in the community ?  ?Baseline: 55 degrees ? ?3/10: 110 09/02/21 (updated) MET  ?[place] will be able to stand for >30'' in SLS stance, to show a significant improvement in balance in order to reduce fall risk  ?  ?Baseline: unable ? ?3/10: MET 09/02/21 (updated) MET  ?5 Jill Mcdaniel will improve the following MMTs to >/= 4+/5 to show improvement in strength:  knee ext, hip abd, hip ext  ?  ?Baseline: unable to test d/t precautions ? ?3/10: see chart 09/02/21 (updated) ongoing  ?  ?PLAN: ?PT FREQUENCY: 1-2x/week ?  ?PT DURATION:  (Ending 09/02/21) ?  ?PLANNED INTERVENTIONS: Therapeutic exercises, Therapeutic activity, Neuro Muscular re-education, Gait training, Patient/Family education, Joint mobilization, Dry Needling, Electrical stimulation, Spinal mobilization and/or manipulation, Moist heat, Taping, Vasopneumatic device, Ionotophoresis 44m/ml Dexamethasone, and Manual therapy ?  ?PLAN FOR NEXT SESSION: progress per protocol, take gait and LEFS for goals, see what MD said about crutches vs walker vs no AD due to pt fear of using crutches, reassess her technique for patella mobs ? ? ? ? ?KMathis DadPT ?08/07/2021, 10:48 AM ? ?   ?

## 2021-08-09 ENCOUNTER — Other Ambulatory Visit: Payer: Self-pay

## 2021-08-09 ENCOUNTER — Encounter: Payer: Self-pay | Admitting: Physical Therapy

## 2021-08-09 ENCOUNTER — Ambulatory Visit: Payer: Medicaid Other | Admitting: Physical Therapy

## 2021-08-09 DIAGNOSIS — M25562 Pain in left knee: Secondary | ICD-10-CM | POA: Diagnosis not present

## 2021-08-09 DIAGNOSIS — R600 Localized edema: Secondary | ICD-10-CM

## 2021-08-09 DIAGNOSIS — R2689 Other abnormalities of gait and mobility: Secondary | ICD-10-CM

## 2021-08-09 DIAGNOSIS — M6281 Muscle weakness (generalized): Secondary | ICD-10-CM

## 2021-08-09 NOTE — Therapy (Signed)
? ?PHYSICAL THERAPY DISCHARGE SUMMARY ? ?Visits from Start of Care: 19 ? ?Current functional level related to goals / functional outcomes: ?See assessment/goals ?  ?Remaining deficits: ?See assessment/goals ?  ?Education / Equipment: ?HEP and D/C plans ? ?Patient agrees to discharge. Patient goals were met. Patient is being discharged due to meeting the stated rehab goals. ? ? ?OUTPATIENT PHYSICAL THERAPY TREATMENT NOTE ? ? ?Patient Name: Jill Mcdaniel ?MRN: [id number] ?DOB:1975-12-15, 46 y.o., female, female ?Today's Date: 08/09/2021 ? ?PCP: Pcp, No ?REFERRING PROVIDER: Hiram Gash, MD ? ? PT End of Session - 08/09/21 1002   ? ? Visit Number 19   ? Number of Visits 24   ? Date for PT Re-Evaluation 09/02/21   extended  ? Authorization Type UHC MCD   ? Authorization - Number of Visits 27   ? Equipment Utilized During Treatment Left knee immobilizer   ? Activity Tolerance Patient tolerated treatment well;No increased pain   ? Behavior During Therapy [facility] for tasks assessed/performed   ? ?  ?  ? ?  ? ? ? ? ? ? ? ? ?Past Medical History:  ?Diagnosis Date  ? Anemia   ? Blood transfusion without reported diagnosis 1997  ? anemia  ? Seropositive for herpes simplex 2 infection   ? Vaginal Pap smear, abnormal   ? ?Past Surgical History:  ?Procedure Laterality Date  ? Abortions    ? CERVICAL CONIZATION W/BX N/A 03/22/2021  ? Procedure: COLD KNIFE CONIZATION CERVIX WITH BIOPSY;  Surgeon: Drema Dallas, DO;  Location: [place];  Service: Gynecology;  Laterality: N/A;  ? CESAREAN SECTION    ? CESAREAN SECTION WITH BILATERAL TUBAL LIGATION Bilateral 04/07/2014  ? Procedure: CESAREAN SECTION WITH BILATERAL TUBAL LIGATION;  Surgeon: Maeola Sarah. Landry Mellow, MD;  Location: [place] [facility];  Service: Obstetrics;  Laterality: Bilateral;  ? FRACTURE SURGERY    ? KNEE ARTHROSCOPY WITH MEDIAL COLLATERAL LIGAMENT RECONSTRUCTION Left 05/01/2021  ? Procedure: KNEE ARTHROSCOPY WITH MEDIAL COLLATERAL LIGAMENT REPAIR;  Surgeon: Hiram Gash, MD;   Location: [place];  Service: Orthopedics;  Laterality: Left;  ? KNEE ARTHROSCOPY WITH POSTERIOR CRUCIATE LIGAMENT (PCL) RECONSTRUCTION Left 05/01/2021  ? Procedure: KNEE ARTHROSCOPY WITH POSTERIOR CRUCIATE LIGAMENT (PCL) RECONSTRUCTION;  Surgeon: Hiram Gash, MD;  Location: [place];  Service: Orthopedics;  Laterality: Left;  ? ?Patient Active Problem List  ? Diagnosis Date Noted  ? S/P cesarean section 04/07/2014  ? ?REFERRING PROVIDER: Hiram Gash, MD ?  ?REFERRING DIAG: Left Knee Arthroscopy with PCL reconstruction and MCL repair on 05/01/2021 ?   ?ONSET DATE: 12/07 ? ?THERAPY DIAG:  ?Left knee pain, unspecified chronicity ? ?Local edema ? ?Muscle weakness ? ?Other abnormalities of gait and mobility ? ?PERTINENT HISTORY: None ?  ?WEIGHT BEARING RESTRICTIONS WEIGHT BEARING RESTRICTIONS Yes TDWB with knee locked in ext at all times for 6 weeks (no resisted knee flexion or hyper ext for 6 months); follow multiligamentous protocol  ? ?SUBJECTIVE:  ? ?Pt states she is doing well.  She feels ready for D/C, mostly d/t a very busy work schedule. ? ?PAIN:  ?Are you having pain? No ?VAS scale: 0/10 ?Pain location: L knee ? ? ? ?OBJECTIVE:  ?  ?  ?LE AROM/PROM: ?  ?A/PROM Right ?05/08/2021 Left ?05/08/2021 12/30 1/3 1/5  1/31 2/3 2/7   ?Hip flexion              ?Hip extension              ?  Hip abduction              ?Hip adduction              ?Hip internal rotation              ?Hip external rotation              ?Knee flexion '110 55 67 80 85 90 97 100 103 107 '  ?Knee extension WNL Lacking 7 degrees Lacking 5 degrees   Lacking ~3 degrees ext Lacking 2 degrees     ?Ankle dorsiflexion              ?Ankle plantarflexion              ?Ankle inversion              ?Ankle eversion              ? (Blank rows = not tested) ?  ?LE MMT: ?  ?MMT Right ?3/10 Left ?3/10 Left ?3/17  ?Hip flexion       ?Knee extension  5  4+   ?Knee flexion       ?Hip abduction   5 5  ?Hip extension   4  4+  ?Hip  external rotation       ?Hip internal rotation       ?Hip adduction       ?Ankle dorsiflexion       ?Ankle plantarflexion       ?Ankle inversion       ?Ankle eversion       ? (Blank rows = not tested; all scores listed out of a possible 5) ?  ?  ? ?  ?[place] Adult PT Treatment:      ? ?Therapeutic Exercise: ? ?Bike 3 min L5 - while taking subjective ?Deep squat with UE support - 2x10 ?S/L wall squat 2x10  ?Step down 4'' step - 3x10 ?Lateral walking with GTB at ankles ?Step up with march. 8'' - 3x10 ?Squats to blue chair + 1 airex 2x10 with mirror for VC ?RDL (L) - 2x10 5# ?    ? ?HOME EXERCISE PROGRAM: ?Access Code: [email or web address] ?URL: [email or web address] ?Date: 08/09/2021 ?Prepared by: [name] ? ?Program Notes ?WEAR BRACE with standing exercises ? ? ?Exercises ?Single Leg Balance with Eyes Closed - 1 x daily - 4 x weekly - 3 sets - 10 reps - 45 hold ?Seated Knee Extension with Resistance - 1 x daily - 4 x weekly - 3 sets - 10 reps ?Squat with Chair Touch - 1 x daily - 4 x weekly - 3 sets - 10 reps ?Forward Step Down - 1 x daily - 4 x weekly - 3 sets - 10 reps ?Single-Leg [place] Deadlift With Kettlebell - 1 x daily - 4 x weekly - 3 sets - 10 reps ? ? ?  ?  ?ASSESSMENT: ? ?Jill Mcdaniel has progressed well with therapy.  Improved impairments include: knee and hip strength, balance.  Functional improvements include: transfers, ambulation, standing tolerance, able to complete all desired activities.  Progressions needed include: continued work on strengthening at home.  Barriers to progress include: NA.  Please see GOALS section for progress on short term and long term goals established at evaluation.  I recommend D/C home with HEP; pt agrees with plan. ? ?  ?GOALS: ?Goals reviewed with patient? Yes ?  ?SHORT TERM GOALS: ?  ?STG Name Target Date Goal status  ?[place] will  be >75% HEP compliant to improve carryover between sessions and facilitate independent management of condition ?  ?Baseline:  No HEP 05/29/2021 MET 1/3  ?  ?LONG TERM GOALS:  ?  ?LTG Name Target Date Goal status  ?1 LEFS 09/02/21 (updated)   ?2 [place] will achieve 3 degrees knee extension by D/C (see POC end date) to improve stability in stance phase of gait  ?  ?Baseline: lacking 7 degrees ? ?2/10: MET 09/02/21 (updated) MET  ?3 [place] will achieve 110 degrees knee flexion by D/C (see POC end date) to improve ability to safely navigate steps in the community ?  ?Baseline: 55 degrees ? ?3/10: 110 09/02/21 (updated) MET  ?[place] will be able to stand for >30'' in SLS stance, to show a significant improvement in balance in order to reduce fall risk  ?  ?Baseline: unable ? ?3/10: MET 09/02/21 (updated) MET  ?5 [place] will improve the following MMTs to >/= 4+/5 to show improvement in strength:  knee ext, hip abd, hip ext  ?  ?Baseline: unable to test d/t precautions ? ?3/10: see chart ? ?3/17: see chart 09/02/21 (updated) MET  ?  ?PLAN: ?PT FREQUENCY: 1-2x/week ?  ?PT DURATION:  (Ending 09/02/21) ?  ?PLANNED INTERVENTIONS: Therapeutic exercises, Therapeutic activity, Neuro Muscular re-education, Gait training, Patient/Family education, Joint mobilization, Dry Needling, Electrical stimulation, Spinal mobilization and/or manipulation, Moist heat, Taping, Vasopneumatic device, Ionotophoresis 68m/ml Dexamethasone, and Manual therapy ?  ?PLAN FOR NEXT SESSION: progress per protocol, take gait and LEFS for goals, see what MD said about crutches vs walker vs no AD due to pt fear of using crutches, reassess her technique for patella mobs ? ? ? ? ?KMathis DadPT ?08/09/2021, 10:42 AM ? ?   ?

## 2021-08-15 ENCOUNTER — Encounter (HOSPITAL_COMMUNITY): Payer: Self-pay

## 2021-08-15 ENCOUNTER — Ambulatory Visit (HOSPITAL_COMMUNITY)
Admission: EM | Admit: 2021-08-15 | Discharge: 2021-08-15 | Disposition: A | Discharge status: Home / Self Care | Payer: Medicaid Other | Attending: Emergency Medicine | Admitting: Emergency Medicine

## 2021-08-15 DIAGNOSIS — M79601 Pain in right arm: Secondary | ICD-10-CM

## 2021-08-15 MED ORDER — PREDNISONE 20 MG PO TABS: 40.0000 mg | ORAL_TABLET | Freq: Every day | ORAL | 0 refills | Status: AC

## 2021-08-15 MED ORDER — KETOROLAC TROMETHAMINE 30 MG/ML IJ SOLN
INTRAMUSCULAR | Status: AC
  Filled 2021-08-15: qty 1

## 2021-08-15 MED ORDER — CYCLOBENZAPRINE HCL 10 MG PO TABS: 10.0000 mg | ORAL_TABLET | Freq: Every day | ORAL | 0 refills | Status: AC

## 2021-08-15 MED ORDER — KETOROLAC TROMETHAMINE 30 MG/ML IJ SOLN
30.0000 mg | Freq: Once | INTRAMUSCULAR | Status: AC
  Administered 2021-08-15: 30 mg via INTRAMUSCULAR

## 2021-08-15 NOTE — ED Triage Notes (Signed)
3 day h/o of left side arm pain and swelling. Pt describes the pain as "feeling like someone punched it". Notes pain with lifting her arm and tenderness to the touch. No bruising or injuries. Has been taking motrin and 800mg  ibuprofen w/temporary relief. No n/t. ?

## 2021-08-15 NOTE — ED Provider Notes (Signed)
?[facility] ? ? ? ?CSN: [id number] ?Arrival date & time: 08/15/21  1617 ? ? ?  ? ?History   ?Chief Complaint ?Chief Complaint  ?Patient presents with  ? Arm Pain  ?  left  ? ? ?HPI ?Jill Mcdaniel is a 46 y.o. female.  ? ?Patient presents with pain to the left bicep for 2 days without precipitating event.  Endorses 4 days ago she had pain to the center of her mid back which resolved spontaneously.  Bicep is tender to palpation.  Limited range of motion, symptoms worse with movement.  Denies numbness, tingling, swelling, deformity or ecchymosis.  Has attempted use of ibuprofen which has been ineffective.   ? ?Past Medical History:  ?Diagnosis Date  ? Anemia   ? Blood transfusion without reported diagnosis 1997  ? anemia  ? Seropositive for herpes simplex 2 infection   ? Vaginal Pap smear, abnormal   ? ? ?Patient Active Problem List  ? Diagnosis Date Noted  ? S/P cesarean section 04/07/2014  ? ? ?Past Surgical History:  ?Procedure Laterality Date  ? Abortions    ? CERVICAL CONIZATION W/BX N/A 03/22/2021  ? Procedure: COLD KNIFE CONIZATION CERVIX WITH BIOPSY;  Surgeon: Steva Ready, DO;  Location: [facility];  Service: Gynecology;  Laterality: N/A;  ? CESAREAN SECTION    ? CESAREAN SECTION WITH BILATERAL TUBAL LIGATION Bilateral 04/07/2014  ? Procedure: CESAREAN SECTION WITH BILATERAL TUBAL LIGATION;  Surgeon: Dorien Chihuahua. Richardson Dopp, MD;  Location: [facility];  Service: Obstetrics;  Laterality: Bilateral;  ? FRACTURE SURGERY    ? KNEE ARTHROSCOPY WITH MEDIAL COLLATERAL LIGAMENT RECONSTRUCTION Left 05/01/2021  ? Procedure: KNEE ARTHROSCOPY WITH MEDIAL COLLATERAL LIGAMENT REPAIR;  Surgeon: Bjorn Pippin, MD;  Location: [facility];  Service: Orthopedics;  Laterality: Left;  ? KNEE ARTHROSCOPY WITH POSTERIOR CRUCIATE LIGAMENT (PCL) RECONSTRUCTION Left 05/01/2021  ? Procedure: KNEE ARTHROSCOPY WITH POSTERIOR CRUCIATE LIGAMENT (PCL) RECONSTRUCTION;  Surgeon: Bjorn Pippin, MD;  Location: [facility];  Service: Orthopedics;  Laterality: Left;  ? ? ?OB History   ? ? Gravida  ?6  ? Para  ?2  ? Term  ?2  ? Preterm  ?   ? AB  ?4  ? Living  ?2  ?  ? ? SAB  ?   ? IAB  ?4  ? Ectopic  ?   ? Multiple  ?0  ? Live Births  ?2  ?   ?  ?  ? ? ? ?Home Medications   ? ?Prior to Admission medications   ?Medication Sig Start Date End Date Taking? Authorizing Provider  ?Black Currant Seed Oil 500 MG CAPS Take by mouth.    [provider]  ?ferrous sulfate 325 (65 FE) MG tablet Take 325 mg by mouth daily with breakfast.    [provider]  ?NON FORMULARY     [provider]  ? ? ?Family History ?Family History  ?Problem Relation Age of Onset  ? Healthy Mother   ? ? ?Social History ?Social History  ? ?Tobacco Use  ? Smoking status: Every Day  ?  Packs/day: 0.25  ?  Types: Cigarettes  ? Smokeless tobacco: Never  ?Vaping Use  ? Vaping Use: Never used  ?Substance Use Topics  ? Alcohol use: No  ?  Comment: socially  ? Drug use: No  ? ? ? ?Allergies   ?Patient has no known allergies. ? ? ?Review of Systems ?Review of Systems ?Defer  to HPI  ? ? ?Physical Exam ?Triage Vital Signs ?ED Triage Vitals  ?Enc Vitals Group  ?   BP 08/15/21 1652 137/88  ?   Pulse Rate 08/15/21 1652 75  ?   Resp 08/15/21 1652 18  ?   Temp 08/15/21 1652 98.3 ?F (36.8 ?C)  ?   Temp Source 08/15/21 1652 Oral  ?   SpO2 08/15/21 1652 98 %  ?   Weight --   ?   Height --   ?   Head Circumference --   ?   Peak Flow --   ?   Pain Score 08/15/21 1649 10  ?   Pain Loc --   ?   Pain Edu? --   ?   Excl. in GC? --   ? ?No data found. ? ?Updated Vital Signs ?BP 137/88 (BP Location: Left Arm)   Pulse 75   Temp 98.3 ?F (36.8 ?C) (Oral)   Resp 18   SpO2 98%  ? ?Visual Acuity ?Right Eye Distance:   ?Left Eye Distance:   ?Bilateral Distance:   ? ?Right Eye Near:   ?Left Eye Near:    ?Bilateral Near:    ? ?Physical Exam ?Constitutional:   ?   Appearance: Normal appearance.  ?HENT:  ?   Head: Normocephalic.  ?Eyes:  ?   Extraocular  Movements: Extraocular movements intact.  ?Pulmonary:  ?   Effort: Pulmonary effort is normal.  ?Musculoskeletal:  ?   Comments: Tenderness to the lateral and anterior aspect of the left bicep, no point tenderness noted, no ecchymosis, swelling or deformity noted, range of motion is intact but elicits pain, 2+ brachial pulse, sensation intact  ?Skin: ?   General: Skin is warm and dry.  ?Neurological:  ?   Mental Status: She is alert and oriented to person, place, and time. Mental status is at baseline.  ?Psychiatric:     ?   Mood and Affect: Mood normal.     ?   Behavior: Behavior normal.  ? ? ? ?UC Treatments / Results  ?Labs ?(all labs ordered are listed, but only abnormal results are displayed) ?Labs Reviewed - No data to display ? ?EKG ? ? ?Radiology ?No results found. ? ?Procedures ?Procedures (including critical care time) ? ?Medications Ordered in UC ?Medications - No data to display ? ?Initial Impression / Assessment and Plan / UC Course  ?I have reviewed the triage vital signs and the nursing notes. ? ?Pertinent labs & imaging results that were available during my care of the patient were reviewed by me and considered in my medical decision making (see chart for details). ? ?Right arm pain ? ?Etiology of symptoms is most likely muscular, will defer imaging today due to lack of injury, discussed with patient, Toradol injection given in office for management of pain, prescribed prednisone 40 mg burst and Flexeril for additional comfort, recommended RICE, heat, pillows for support, daily stretching and activity as tolerated, given walking referral to orthopedics for persisting symptoms ?Final Clinical Impressions(s) / UC Diagnoses  ? ?Final diagnoses:  ?None  ? ?Discharge Instructions   ?None ?  ? ?ED Prescriptions   ?None ?  ? ?PDMP not reviewed this encounter. ?  ?Valinda Hoar, NP ?08/19/21 3474 ? ?

## 2021-08-15 NOTE — Discharge Instructions (Signed)
Your pain is most likely caused by irritation to the muscles or ligaments.  ? ?Starting tomorrow take prednisone every morning with food for 5 days, this medication is to further help reduce your inflammation and ideally help with your pain ? ?You may use muscle relaxer for additional comfort at bedtime, be mindful of this patient may make you drowsy ? ?You may use heating pad in 15 minute intervals as needed for additional comfort or you may find comfort in using ice in 10-15 minutes over affected area ? ?Begin stretching affected area daily for 10 minutes as tolerated to further loosen muscles  ? ?When lying down place pillow underneath and between knees for support ? ?Can try sleeping without pillow on firm mattress  ? ?Practice good posture: head back, shoulders back, chest forward, pelvis back and weight distributed evenly on both legs ? ?If pain persist after recommended treatment or reoccurs if may be beneficial to follow up with orthopedic specialist for evaluation, this doctor specializes in the bones and can manage your symptoms long-term with options such as but not limited to imaging, medications or physical therapy  ?  ?

## 2022-04-25 ENCOUNTER — Other Ambulatory Visit: Payer: Self-pay | Admitting: Nurse Practitioner

## 2022-04-25 DIAGNOSIS — Z1231 Encounter for screening mammogram for malignant neoplasm of breast: Secondary | ICD-10-CM

## 2022-06-20 ENCOUNTER — Ambulatory Visit
Admission: RE | Admit: 2022-06-20 | Discharge: 2022-06-20 | Disposition: A | Discharge status: Home / Self Care | Payer: Medicaid Other | Source: Ambulatory Visit | Attending: Nurse Practitioner | Admitting: Nurse Practitioner

## 2022-06-20 DIAGNOSIS — Z1231 Encounter for screening mammogram for malignant neoplasm of breast: Secondary | ICD-10-CM
